# Patient Record
Sex: Male | Born: 1945 | Race: White | Hispanic: No | Smoking: Current every day smoker
Health system: Southern US, Community
[De-identification: ages and names within clinical notes are randomized; demographics above are authoritative.]

## PROBLEM LIST (undated history)

## (undated) DIAGNOSIS — I10 Essential (primary) hypertension: Secondary | ICD-10-CM

## (undated) DIAGNOSIS — E039 Hypothyroidism, unspecified: Secondary | ICD-10-CM

## (undated) DIAGNOSIS — R42 Dizziness and giddiness: Secondary | ICD-10-CM

## (undated) DIAGNOSIS — G8929 Other chronic pain: Secondary | ICD-10-CM

## (undated) DIAGNOSIS — R519 Headache, unspecified: Secondary | ICD-10-CM

## (undated) DIAGNOSIS — H9313 Tinnitus, bilateral: Secondary | ICD-10-CM

## (undated) HISTORY — DX: Headache, unspecified: R51.9

## (undated) HISTORY — DX: Tinnitus, bilateral: H93.13

## (undated) HISTORY — PX: MANDIBLE FRACTURE SURGERY: SHX706

## (undated) HISTORY — PX: KNEE SURGERY: SHX244

## (undated) HISTORY — DX: Essential (primary) hypertension: I10

## (undated) HISTORY — DX: Other chronic pain: G89.29

## (undated) HISTORY — DX: Hypothyroidism, unspecified: E03.9

## (undated) HISTORY — DX: Dizziness and giddiness: R42

---

## 2006-09-11 ENCOUNTER — Emergency Department (HOSPITAL_COMMUNITY): Admission: EM | Admit: 2006-09-11 | Discharge: 2006-09-11 | Payer: Self-pay | Admitting: Emergency Medicine

## 2006-11-18 ENCOUNTER — Emergency Department (HOSPITAL_COMMUNITY): Admission: EM | Admit: 2006-11-18 | Discharge: 2006-11-18 | Payer: Self-pay | Admitting: Emergency Medicine

## 2015-07-31 ENCOUNTER — Emergency Department (HOSPITAL_COMMUNITY): Payer: Medicare Other

## 2015-07-31 ENCOUNTER — Emergency Department (HOSPITAL_COMMUNITY)
Admission: EM | Admit: 2015-07-31 | Discharge: 2015-07-31 | Disposition: A | Discharge status: Home / Self Care | Payer: Medicare Other | Attending: Emergency Medicine | Admitting: Emergency Medicine

## 2015-07-31 ENCOUNTER — Encounter (HOSPITAL_COMMUNITY): Payer: Self-pay | Admitting: *Deleted

## 2015-07-31 DIAGNOSIS — W11XXXA Fall on and from ladder, initial encounter: Secondary | ICD-10-CM | POA: Insufficient documentation

## 2015-07-31 DIAGNOSIS — M25561 Pain in right knee: Secondary | ICD-10-CM | POA: Insufficient documentation

## 2015-07-31 DIAGNOSIS — Z720 Tobacco use: Secondary | ICD-10-CM | POA: Insufficient documentation

## 2015-07-31 DIAGNOSIS — R2241 Localized swelling, mass and lump, right lower limb: Secondary | ICD-10-CM | POA: Insufficient documentation

## 2015-07-31 DIAGNOSIS — Y929 Unspecified place or not applicable: Secondary | ICD-10-CM | POA: Diagnosis not present

## 2015-07-31 DIAGNOSIS — Y939 Activity, unspecified: Secondary | ICD-10-CM | POA: Insufficient documentation

## 2015-07-31 DIAGNOSIS — Y999 Unspecified external cause status: Secondary | ICD-10-CM | POA: Diagnosis not present

## 2015-07-31 MED ORDER — HYDROCODONE-ACETAMINOPHEN 5-325 MG PO TABS: 1.0000 | ORAL_TABLET | Freq: Four times a day (QID) | ORAL | Status: DC | PRN

## 2015-07-31 MED ORDER — NAPROXEN 500 MG PO TABS: 500.0000 mg | ORAL_TABLET | Freq: Two times a day (BID) | ORAL | Status: DC

## 2015-07-31 NOTE — Discharge Instructions (Signed)
X-ray of the right knee without any bony injuries. Use knee immobilizer for comfort. Can take it off to take shower. Make an appointment to follow-up with orthopedic surgery. Take the Naprosyn on a regular basis supplement with the hydrocodone as needed for additional pain relief.

## 2015-07-31 NOTE — ED Notes (Signed)
Dc instructions reviewed with pt. Voiced understanding. Pt report he has a knee immobilizer at home and does not need another one. 2 Rx given

## 2015-07-31 NOTE — ED Provider Notes (Signed)
CSN: [id number]     Arrival date & time 07/31/15  1027 History  By signing my name below, I, Emmanuella Mensah, attest that this documentation has been prepared under the direction and in the presence of Vanetta Mulders, MD. Electronically Signed: Angelene Giovanni, ED Scribe. 07/31/2015. 11:49 AM.    Chief Complaint  Patient presents with  . Knee Injury   The history is provided by the patient. No language interpreter was used.    HPI Comments: Joseph Glass is a 70 y.o. male who presents to the Emergency Department complaining of gradually worsening 10/10 right knee pain onset 1.5 weeks ago. He reports associated mild swelling in right knee. He explains his pain as a "needle pinch" that is worse in the morning when he wakes up. He states that he fell down a ladder in October 2016 and his right knee hurt for only 2 days. He explains that he had right knee surgery approx. 5-6 years ago in [place], [place]. No alleviating factors noted. Pt has not tried any medications PTA. He denies any fever, chills, rash, or any open wounds.     History reviewed. No pertinent past medical history. Past Surgical History  Procedure Laterality Date  . Knee surgery     No family history on file. Social History  Substance Use Topics  . Smoking status: Former Games developer  . Smokeless tobacco: Current User  . Alcohol Use: No    Review of Systems  Constitutional: Negative for fever and chills.  HENT: Positive for congestion. Negative for rhinorrhea and sore throat.   Eyes: Negative for visual disturbance.  Respiratory: Negative for cough and shortness of breath.   Cardiovascular: Negative for chest pain and leg swelling.  Gastrointestinal: Negative for nausea, vomiting, abdominal pain and diarrhea.  Genitourinary: Negative for dysuria and hematuria.  Musculoskeletal: Positive for back pain (chronic). Negative for joint swelling.  Skin: Negative for rash.  Neurological: Negative for headaches.  Hematological: Does  not bruise/bleed easily.      Allergies  Review of patient's allergies indicates no known allergies.  Home Medications   Prior to Admission medications   Medication Sig Start Date End Date Taking? Authorizing Provider  HYDROcodone-acetaminophen (NORCO/VICODIN) 5-325 MG tablet Take 1-2 tablets by mouth every 6 (six) hours as needed. 07/31/15   Vanetta Mulders, MD  naproxen (NAPROSYN) 500 MG tablet Take 1 tablet (500 mg total) by mouth 2 (two) times daily. 07/31/15   Vanetta Mulders, MD   BP 157/93 mmHg  Pulse 78  Temp(Src) 97.8 F (36.6 C) (Oral)  Resp 16  Ht  (1.651 m)  Wt 65.772 kg  BMI 24.13 kg/m2  SpO2 99% Physical Exam  Constitutional: He is oriented to person, place, and time. He appears well-developed and well-nourished. No distress.  HENT:  Head: Normocephalic and atraumatic.  Eyes: Conjunctivae and EOM are normal. Pupils are equal, round, and reactive to light.  Eyes tracking normal Sclera clear  Neck: Neck supple. No tracheal deviation present.  Cardiovascular: Normal rate and regular rhythm.   Pulmonary/Chest: Effort normal. No respiratory distress.  Lungs clear bilaterally  Abdominal: Bowel sounds are normal. There is no tenderness.  Musculoskeletal: Normal range of motion.  No ankle swelling DP pulses 2+ Right knee cap in place, mild swelling around the knee, no joint line tenderness, medial tenderness to the knee.   Neurological: He is alert and oriented to person, place, and time. No cranial nerve deficit. He exhibits normal muscle tone. Coordination normal.  Skin: Skin is warm  and dry.  Psychiatric: He has a normal mood and affect. His behavior is normal.  Nursing note and vitals reviewed.   ED Course  Procedures (including critical care time) DIAGNOSTIC STUDIES: Oxygen Saturation is 99% on RA, normal by my interpretation.    COORDINATION OF CARE: 11:37 AM- Pt advised of plan for treatment and pt agrees. Pt will receive knee x-ray for further  evaluation.   Imaging Review Dg Knee Complete 4 Views Right  07/31/2015  CLINICAL DATA:  Medial right knee pain for 1-2 weeks. Larey SeatFell off a ladder last November. EXAM: RIGHT KNEE - COMPLETE 4+ VIEW COMPARISON:  None. FINDINGS: Joint space narrowing in the medial compartment with mild spurring in the medial compartment. No fracture, subluxation or dislocation. No visible joint effusion although the lateral view is suboptimal and obliqued. IMPRESSION: Mild degenerative changes.  No acute bony abnormality. Electronically Signed   By: Charlett NoseKevin  Dover M.D.   On: 07/31/2015 11:25     Vanetta MuldersScott Jeanclaude Wentworth, MD has personally reviewed and evaluated these images as part of his medical decision-making.  MDM   Final diagnoses:  Knee pain, acute, right    Patient without acute injury to the right knee. Did have an injury back in October healed up. For the past week and a half and having a complaint of right knee pain and stiffness. X-rays negative for any bony injuries. Will refer to orthopedics for further evaluation. Will treat with knee immobilizer anti-inflammatories and pain medicine.   I personally performed the services described in this documentation, which was scribed in my presence. The recorded information has been reviewed and is accurate.     Vanetta MuldersScott Mikenzie Mccannon, MD 07/31/15 1213

## 2015-07-31 NOTE — ED Notes (Signed)
Pt states he has had chronic right knee pain with worsening since October. States he injured it then while trimming a tree. No other problem noted. Pt ambulatory.

## 2015-08-15 ENCOUNTER — Encounter: Payer: Self-pay | Admitting: Orthopaedic Surgery

## 2015-08-15 ENCOUNTER — Telehealth: Payer: Self-pay | Admitting: *Deleted

## 2015-08-15 ENCOUNTER — Ambulatory Visit (INDEPENDENT_AMBULATORY_CARE_PROVIDER_SITE_OTHER): Payer: Medicare Other | Admitting: Orthopaedic Surgery

## 2015-08-15 VITALS — Temp 97.2°F | Ht 65.0 in | Wt 144.0 lb

## 2015-08-15 DIAGNOSIS — M25561 Pain in right knee: Secondary | ICD-10-CM

## 2015-08-15 MED ORDER — HYDROCODONE-ACETAMINOPHEN 5-325 MG PO TABS: 1.0000 | ORAL_TABLET | ORAL | Status: DC | PRN

## 2015-08-15 MED ORDER — NAPROXEN 500 MG PO TABS: 500.0000 mg | ORAL_TABLET | Freq: Two times a day (BID) | ORAL | Status: DC

## 2015-08-15 NOTE — Progress Notes (Signed)
Subjective:    Patient ID: Joseph Glass, male    DOB: 11-19-1945, 70 y.o.   MRN: [id number]  Knee Pain  There was no injury mechanism. The pain is present in the right knee. The quality of the pain is described as burning and aching. The pain is at a severity of 5/10. The pain is moderate. The pain has been worsening since onset. Associated symptoms include a loss of motion. Pertinent negatives include no inability to bear weight, loss of sensation, muscle weakness, numbness or tingling. The symptoms are aggravated by weight bearing. He has tried heat, ice, NSAIDs and rest for the symptoms. The treatment provided moderate relief.   He has had increasing pain in the right knee over the last several weeks.  He has no trauma.  He has pain of the medial side of the right knee.  He has no giving way but feels it might.  He went to the ER on 07-31-15.  I have reviewed the notes and the x-rays and x-ray report.  He has DJD changes of the right knee.  He has had right knee surgery in the past in [place].  I will order a MRI of the knee as he is not getting better and has feelings of instability.   Review of Systems  HENT: Negative for congestion.   Respiratory: Negative for cough and shortness of breath.   Cardiovascular: Negative for leg swelling.  Endocrine: Negative for cold intolerance.  Musculoskeletal: Positive for joint swelling, arthralgias and gait problem.  Allergic/Immunologic: Negative for environmental allergies.  Neurological: Negative for tingling and numbness.   History reviewed. No pertinent past medical history.  Past Surgical History  Procedure Laterality Date  . Knee surgery      No current outpatient prescriptions on file prior to visit.   No current facility-administered medications on file prior to visit.    Social History   Social History  . Marital Status: Married    Spouse Name: N/A  . Number of Children: N/A  . Years of Education: N/A   Occupational  History  . Not on file.   Social History Main Topics  . Smoking status: Former Games developer  . Smokeless tobacco: Current User  . Alcohol Use: No  . Drug Use: No  . Sexual Activity: Not on file   Other Topics Concern  . Not on file   Social History Narrative    Temp(Src) 97.2 F (36.2 C)  Ht  (1.651 m)  Wt 144 lb (65.318 kg)  BMI 23.96 kg/m2     Objective:   Physical Exam  Constitutional: He is oriented to person, place, and time. He appears well-developed and well-nourished.  HENT:  Head: Normocephalic and atraumatic.  Eyes: Conjunctivae and EOM are normal. Pupils are equal, round, and reactive to light.  Neck: Normal range of motion. Neck supple.  Cardiovascular: Normal rate, regular rhythm and intact distal pulses.   Pulmonary/Chest: Effort normal.  Abdominal: Soft.  Musculoskeletal: He exhibits tenderness (pain right knee with ROM 0 to 100, crepitus, effusion, positive medial McMurray.  Limp to the right.  Left knee negative.).       Right knee: He exhibits decreased range of motion and effusion. Tenderness found. Medial joint line tenderness noted.       Legs: Neurological: He is alert and oriented to person, place, and time. He has normal reflexes. No cranial nerve deficit. He exhibits normal muscle tone. Coordination normal.  Skin: Skin is warm and dry.  Psychiatric:  He has a normal mood and affect. His behavior is normal. Judgment and thought content normal.       Assessment & Plan:   Encounter Diagnosis  Name Primary?  . Right knee pain Yes  Get MRI of the right knee.  Rx for hydrocodone and Naprosyn given.  Precautions discussed.  RTC after MRI of the right knee.  Call if any problem.

## 2015-08-15 NOTE — Telephone Encounter (Signed)
MRI scheduled for 08/18/15 at 8:00 am

## 2015-08-16 ENCOUNTER — Telehealth: Payer: Self-pay | Admitting: Orthopaedic Surgery

## 2015-08-16 NOTE — Telephone Encounter (Signed)
Patient called and wants to cancel MRI that is scheduled for 08/18/15 @[facility] . He states that he can't afford it.

## 2015-08-16 NOTE — Telephone Encounter (Signed)
Joseph Glass Karen with radiology and cancelled MRI appointment.

## 2015-08-16 NOTE — Telephone Encounter (Signed)
[facility]

## 2015-08-18 ENCOUNTER — Ambulatory Visit (HOSPITAL_COMMUNITY): Payer: Medicare Other

## 2015-08-23 ENCOUNTER — Telehealth: Payer: Self-pay | Admitting: Orthopaedic Surgery

## 2015-08-23 NOTE — Telephone Encounter (Signed)
Patient called and now wants to reschedule MRI.  Could you please set this up and then let the patient know appointment?  Thanks

## 2015-08-31 NOTE — Telephone Encounter (Signed)
Patient returned Tammy's call about re-scheduling MRI at [facility] which he cancelled in April. Please let patient know new MRI appointment.  (His insurance is Medicare only - no pre-authorization needed)

## 2015-09-06 NOTE — Telephone Encounter (Signed)
Rescheduled MRI and spoke with patient and gave him his new appointment times.

## 2015-09-11 ENCOUNTER — Ambulatory Visit (HOSPITAL_COMMUNITY)
Admission: RE | Admit: 2015-09-11 | Discharge: 2015-09-11 | Disposition: A | Discharge status: Home / Self Care | Payer: Medicare Other | Source: Ambulatory Visit | Attending: Orthopaedic Surgery | Admitting: Orthopaedic Surgery

## 2015-09-11 DIAGNOSIS — M2241 Chondromalacia patellae, right knee: Secondary | ICD-10-CM | POA: Insufficient documentation

## 2015-09-11 DIAGNOSIS — M25561 Pain in right knee: Secondary | ICD-10-CM | POA: Insufficient documentation

## 2015-09-11 DIAGNOSIS — X58XXXA Exposure to other specified factors, initial encounter: Secondary | ICD-10-CM | POA: Diagnosis not present

## 2015-09-11 DIAGNOSIS — S83206A Unspecified tear of unspecified meniscus, current injury, right knee, initial encounter: Secondary | ICD-10-CM | POA: Diagnosis not present

## 2015-09-13 ENCOUNTER — Ambulatory Visit (INDEPENDENT_AMBULATORY_CARE_PROVIDER_SITE_OTHER): Payer: Medicare Other | Admitting: Orthopaedic Surgery

## 2015-09-13 ENCOUNTER — Encounter: Payer: Self-pay | Admitting: Orthopaedic Surgery

## 2015-09-13 VITALS — BP 136/90 | HR 80 | Temp 97.7°F | Ht 65.0 in | Wt 142.0 lb

## 2015-09-13 DIAGNOSIS — M25561 Pain in right knee: Secondary | ICD-10-CM

## 2015-09-13 NOTE — Progress Notes (Signed)
Patient Joseph Glass, male DOB:20-May-1945, 70 y.o. VWU:[id number]  Chief Complaint  Patient presents with  . Results    MRI Right Knee    HPI  [name] is a 70 y.o. male who has had right knee pain and giving way and swelling.  It is still present.  He had a MRI of the right knee done on 09-12-15.  It shows: IMPRESSION: 1. Complex tear of the posterior horn- body of the medial meniscus. 2. Small radial tear of the free edge of the body of the lateral meniscus. 3. 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 10 mm osteochondral lesion involving the lateral aspect of the medial femoral condyle with fluid undercutting a portion of the fragment concerning for an unstable fragment. 4. Chondromalacia of the medial patellofemoral compartment.  I have recommended arthroscopy of the right knee.  I went over the procedure with him.  I showed him a model of the knee.  He asked appropriate questions.  I will have him seen by Dr. Romeo Apple in this office for arthroscopy.  The patient is agreeable.  HPI  Body mass index is 23.63 kg/(m^2).  ROS  Review of Systems  HENT: Negative for congestion.   Respiratory: Negative for cough and shortness of breath.   Cardiovascular: Negative for leg swelling.  Endocrine: Negative for cold intolerance.  Musculoskeletal: Positive for joint swelling, arthralgias and gait problem.  Allergic/Immunologic: Negative for environmental allergies.  Neurological: Negative for numbness.    History reviewed. No pertinent past medical history.  Past Surgical History  Procedure Laterality Date  . Knee surgery      History reviewed. No pertinent family history.  Social History Social History  Substance Use Topics  . Smoking status: Former Games developer  . Smokeless tobacco: Current User  . Alcohol Use: No    No Known Allergies  Current Outpatient  Prescriptions  Medication Sig Dispense Refill  . HYDROcodone-acetaminophen (NORCO/VICODIN) 5-325 MG tablet Take 1 tablet by mouth every 4 (four) hours as needed for moderate pain (Must last 30 days.  Do not take and drive a car or use machinery.). 120 tablet 0  . naproxen (NAPROSYN) 500 MG tablet Take 1 tablet (500 mg total) by mouth 2 (two) times daily with a meal. (Patient not taking: Reported on 09/13/2015) 60 tablet 5   No current facility-administered medications for this visit.     Physical Exam  Blood pressure 136/90, pulse 80, temperature 97.7 F (36.5 C), height  (1.651 m), weight 142 lb (64.411 kg).  Constitutional: overall normal hygiene, normal nutrition, well developed, normal grooming, normal body habitus. Assistive device:neoprene sleeve brace  Musculoskeletal: gait and station Limp right, muscle tone and strength are normal, no tremors or atrophy is present.  .  Neurological: coordination overall normal.  Deep tendon reflex/nerve stretch intact.  Sensation normal.  Cranial nerves II-XII intact.   Skin:   normal overall no scars, lesions, ulcers or rashes. No psoriasis.  Psychiatric: Alert and oriented x 3.  Recent memory intact, remote memory unclear.  Normal mood and affect. Well groomed.  Good eye contact.  Cardiovascular: overall no swelling, no varicosities, no edema bilaterally, normal temperatures of the legs and arms, no clubbing, cyanosis and good capillary refill.  Lymphatic: palpation is normal.  The right lower extremity is examined:  Inspection:  Thigh:  Non-tender and no defects  Knee has swelling 2+ effusion.  Joint tenderness is present                        Patient is tender over the medial joint line  Lower Leg:  Has normal appearance and no tenderness or defects  Ankle:  Non-tender and no defects  Foot:  Non-tender and no defects Range of Motion:  Knee:  Range of motion is: 0-100                        Crepitus is   present  Ankle:  Range of motion is normal. Strength and Tone:  The right lower extremity has normal strength and tone. Stability:  Knee:  The knee has positive medial McMurray.  Ankle:  The ankle is stable.   The patient has been educated about the nature of the problem(s) and counseled on treatment options.  The patient appeared to understand what I have discussed and is in agreement with it.  Encounter Diagnosis  Name Primary?  . Right knee pain Yes    PLAN Call if any problems.  Precautions discussed.  Continue current medications.   Return to clinic to see Dr. Romeo AppleHarrison.

## 2015-09-20 ENCOUNTER — Telehealth: Payer: Self-pay | Admitting: Orthopaedic Surgery

## 2015-09-20 MED ORDER — HYDROCODONE-ACETAMINOPHEN 5-325 MG PO TABS: 1.0000 | ORAL_TABLET | ORAL | Status: DC | PRN

## 2015-09-20 NOTE — Telephone Encounter (Signed)
Rx done. 

## 2015-09-20 NOTE — Telephone Encounter (Signed)
Patient requests refill on medication: HYDROcodone-acetaminophen (NORCO/VICODIN) 5-325 MG tablet [[id number]][[id number]] - quantity 120.  Please advise.

## 2015-09-24 ENCOUNTER — Emergency Department (HOSPITAL_COMMUNITY): Payer: Medicare Other

## 2015-09-24 ENCOUNTER — Encounter (HOSPITAL_COMMUNITY): Payer: Self-pay | Admitting: Emergency Medicine

## 2015-09-24 ENCOUNTER — Emergency Department (HOSPITAL_COMMUNITY)
Admission: EM | Admit: 2015-09-24 | Discharge: 2015-09-24 | Disposition: A | Discharge status: Home / Self Care | Payer: Medicare Other | Attending: Emergency Medicine | Admitting: Emergency Medicine

## 2015-09-24 DIAGNOSIS — W010XXD Fall on same level from slipping, tripping and stumbling without subsequent striking against object, subsequent encounter: Secondary | ICD-10-CM | POA: Insufficient documentation

## 2015-09-24 DIAGNOSIS — S42002D Fracture of unspecified part of left clavicle, subsequent encounter for fracture with routine healing: Secondary | ICD-10-CM

## 2015-09-24 DIAGNOSIS — S42022D Displaced fracture of shaft of left clavicle, subsequent encounter for fracture with routine healing: Secondary | ICD-10-CM | POA: Diagnosis not present

## 2015-09-24 DIAGNOSIS — Z79891 Long term (current) use of opiate analgesic: Secondary | ICD-10-CM | POA: Diagnosis not present

## 2015-09-24 DIAGNOSIS — Z87891 Personal history of nicotine dependence: Secondary | ICD-10-CM | POA: Diagnosis not present

## 2015-09-24 DIAGNOSIS — S4992XD Unspecified injury of left shoulder and upper arm, subsequent encounter: Secondary | ICD-10-CM | POA: Diagnosis present

## 2015-09-24 NOTE — ED Notes (Signed)
Pt states he was cleaning his pond out yesterday and slipped and fell.  C/o left shoulder pain/clavicle pain.  Deformity noted with significant swelling.

## 2015-09-24 NOTE — ED Provider Notes (Signed)
CSN: [id number]     Arrival date & time 09/24/15  1055 History  By signing my name below, I, Terrance Branch, attest that this documentation has been prepared under the direction and in the presence of Lavera Vandermeer, PA-C. Electronically Signed: Evon Slackerrance Branch, ED Scribe. 09/24/2015. 12:18 PM.     Chief Complaint  Patient presents with  . Shoulder Injury   Patient is a 70 y.o. male presenting with shoulder injury. The history is provided by the patient. No language interpreter was used.  Shoulder Injury Pertinent negatives include no chest pain, no abdominal pain, no headaches and no shortness of breath.   HPI Comments: Ursula BeathGeorge Gaza is a 70 y.o. male who presents to the Emergency Department complaining of left shoulder injury onset 1 day prior. Pt reports that he was cleaning out his pond yesterday, slipped and fell landing on his left shoulder. Pt presents with his left arm in a sling. Pt states that that the pain is worse with movement. Pt reports taking pain medication PTA. Pt reports hitting his head but denies LOC, headache, dizziness or visual changes. Pt denies numbness or tingling or swelling other than area of the collar bone.    History reviewed. No pertinent past medical history. Past Surgical History  Procedure Laterality Date  . Knee surgery     History reviewed. No pertinent family history. Social History  Substance Use Topics  . Smoking status: Former Games developermoker  . Smokeless tobacco: Current User  . Alcohol Use: No    Review of Systems  Constitutional: Negative for fever and chills.  Respiratory: Negative for shortness of breath.   Cardiovascular: Negative for chest pain.  Gastrointestinal: Negative for abdominal pain and abdominal distention.  Musculoskeletal: Positive for joint swelling and arthralgias (left shoulder pain). Negative for neck pain.  Skin: Negative for color change and wound.  Neurological: Negative for dizziness, syncope, numbness and headaches.   All other systems reviewed and are negative.    Allergies  Review of patient's allergies indicates no known allergies.  Home Medications   Prior to Admission medications   Medication Sig Start Date End Date Taking? Authorizing Provider  HYDROcodone-acetaminophen (NORCO/VICODIN) 5-325 MG tablet Take 1 tablet by mouth every 4 (four) hours as needed for moderate pain (Must last 30 days.  Do not take and drive a car or use machinery.). 09/20/15  Yes Darreld McleanWayne Keeling, MD  naproxen (NAPROSYN) 500 MG tablet Take 1 tablet (500 mg total) by mouth 2 (two) times daily with a meal. 08/15/15  Yes Darreld McleanWayne Keeling, MD   BP 134/87 mmHg  Pulse 74  Temp(Src) 98.1 F (36.7 C) (Tympanic)  Ht 5\' 5"  (1.651 m)  Wt 142 lb (64.411 kg)  BMI 23.63 kg/m2  SpO2 97%    Physical Exam  Constitutional: He is oriented to person, place, and time. He appears well-developed and well-nourished. No distress.  HENT:  Head: Normocephalic and atraumatic.  Eyes: Conjunctivae and EOM are normal.  Neck: Neck supple. No tracheal deviation present.  Cardiovascular: Normal rate.   Pulmonary/Chest: Effort normal. No respiratory distress.  Musculoskeletal: Normal range of motion.  Edema and bony deformity mid shaft left clavicle, pt currently wearing a sling. Radial pulse is brisk, distal sensation and grip strength intact. Compartments soft  Neurological: He is alert and oriented to person, place, and time.  Skin: Skin is warm and dry.  Psychiatric: He has a normal mood and affect. His behavior is normal.  Nursing note and vitals reviewed.   ED Course  Procedures (including critical care time) DIAGNOSTIC STUDIES: Oxygen Saturation is 97% on RA, normal by my interpretation.    COORDINATION OF CARE: 12:17 PM-Discussed treatment plan with pt at bedside and pt agreed to plan.     Labs Review Labs Reviewed - No data to display  Imaging Review Dg Shoulder Left  09/24/2015  CLINICAL DATA:  Fall yesterday.  Left shoulder  pain. EXAM: LEFT SHOULDER - 2+ VIEW COMPARISON:  Left shoulder imaging performed yesterday. Chest x-ray 08/20/2014. FINDINGS: There is a comminuted displaced mid left clavicle fracture. Distal fragment is displaced superiorly greater than 1 shaft with with overlapping fracture fragments. No change since yesterday's imaging at [facility]. Glenohumeral joint and AC joint appear intact. IMPRESSION: Comminuted, displaced and overlapping mid left clavicle fracture. Electronically Signed   By: Charlett Nose M.D.   On: 09/24/2015 12:01   I have personally reviewed and evaluated these images as part of my medical decision-making.   EKG Interpretation None      MDM   Final diagnoses:  Clavicle fracture, left, with routine healing, subsequent encounter    Pt reviewed on the [place] narcotic database, received #120 hydrocodone on 09/20/15.  fx of left clavicle and patient seen at another hospital for same and did not notify triage or myself initially until directly questioned.  Currently wearing a sling, NV intact.  No open wounds.  Sees orthopedics for knee pain and agrees to arrange f/u this week. He understands that further narcotics will not be given.  I personally performed the services described in this documentation, which was scribed in my presence. The recorded information has been reviewed and is accurate.      Pauline Aus, PA-C 09/25/15 2031  Eber Hong, MD 09/25/15 2148

## 2015-09-25 ENCOUNTER — Encounter: Payer: Self-pay | Admitting: Orthopedic Surgery

## 2015-09-25 ENCOUNTER — Ambulatory Visit (INDEPENDENT_AMBULATORY_CARE_PROVIDER_SITE_OTHER): Payer: Medicare Other | Admitting: Orthopedic Surgery

## 2015-09-25 VITALS — BP 142/88 | Ht 65.0 in | Wt 142.0 lb

## 2015-09-25 DIAGNOSIS — S42002A Fracture of unspecified part of left clavicle, initial encounter for closed fracture: Secondary | ICD-10-CM

## 2015-09-25 NOTE — Patient Instructions (Signed)
Clavicle Fracture  The clavicle, also called the collarbone, is the long bone that connects your shoulder to your rib cage. You can feel your collarbone at the top of your shoulders and rib cage. A clavicle fracture is a broken clavicle. It is a common injury that can happen at any age.   CAUSES  Common causes of a clavicle fracture include:  · A direct blow to your shoulder.  · A car accident.  · A fall, especially if you try to break your fall with an outstretched arm.  RISK FACTORS  You may be at increased risk if:  · You are younger than 25 years or older than 75 years. Most clavicle fractures happen to people who are younger than 25 years.  · You are a male.  · You play contact sports.  SIGNS AND SYMPTOMS  A fractured clavicle is painful. It also makes it hard to move your arm. Other signs and symptoms may include:  · A shoulder that drops downward and forward.  · Pain when trying to lift your shoulder.  · Bruising, swelling, and tenderness over your clavicle.  · A grinding noise when you try to move your shoulder.  · A bump over your clavicle.  DIAGNOSIS  Your health care provider can usually diagnose a clavicle fracture by asking about your injury and examining your shoulder and clavicle. He or she may take an X-ray to determine the position of your clavicle.  TREATMENT  Treatment depends on the position of your clavicle after the fracture:  · If the broken ends of the bone are not out of place, your health care provider may put your arm in a sling or wrap a support bandage around your chest (figure-of-eight wrap).  · If the broken ends of the bone are out of place, you may need surgery. Surgery may involve placing screws, pins, or plates to keep your clavicle stable while it heals. Healing may take about 3 months.  When your health care provider thinks your fracture has healed enough, you may have to do physical therapy to regain normal movement and build up your arm strength.  HOME CARE INSTRUCTIONS    · Apply ice to the injured area:    Put ice in a plastic bag.    Place a towel between your skin and the bag.    Leave the ice on for 20 minutes, 2-3 times a day.  · If you have a wrap or splint:    Wear it all the time, and remove it only to take a bath or shower.    When you bathe or shower, keep your shoulder in the same position as when the sling or wrap is on.    Do not lift your arm.  · If you have a figure-of-eight wrap:    Another person must tighten it every day.    It should be tight enough to hold your shoulders back.    Allow enough room to place your index finger between your body and the strap.    Loosen the wrap immediately if you feel numbness or tingling in your hands.  · Only take medicines as directed by your health care provider.  · Avoid activities that make the injury or pain worse for 4-6 weeks after surgery.  · Keep all follow-up appointments.  SEEK MEDICAL CARE IF:   Your medicine is not helping to relieve pain and swelling.  SEEK IMMEDIATE MEDICAL CARE IF:   Your arm is   numb, cold, or pale, even when the splint is loose.  MAKE SURE YOU:   · Understand these instructions.  · Will watch your condition.  · Will get help right away if you are not doing well or get worse.     This information is not intended to replace advice given to you by your health care provider. Make sure you discuss any questions you have with your health care provider.     Document Released: 01/16/2005 Document Revised: 04/13/2013 Document Reviewed: 03/01/2013  Elsevier Interactive Patient Education ©2016 [employer]

## 2015-09-25 NOTE — Progress Notes (Signed)
Chief Complaint  Patient presents with  . Clavicle Injury    ER FOLLOW UP LEFT CLAVICLE FX, DOI 09/23/15   HPI 70 year old male fell 2 days ago injured his left clavicle. He complains of left shoulder pain dull ache moderately severe 2 days duration constant worse with motion  Review of Systems  Constitutional: Negative for fever and chills.  Neurological: Negative for tingling.    No past medical history on file.  Past Surgical History  Procedure Laterality Date  . Knee surgery     No family history on file. Social History  Substance Use Topics  . Smoking status: Former Games developermoker  . Smokeless tobacco: Current User  . Alcohol Use: No    Current outpatient prescriptions:  .  HYDROcodone-acetaminophen (NORCO/VICODIN) 5-325 MG tablet, Take 1 tablet by mouth every 4 (four) hours as needed for moderate pain (Must last 30 days.  Do not take and drive a car or use machinery.)., Disp: 120 tablet, Rfl: 0 .  naproxen (NAPROSYN) 500 MG tablet, Take 1 tablet (500 mg total) by mouth 2 (two) times daily with a meal., Disp: 60 tablet, Rfl: 5  BP 142/88 mmHg  Ht 5\' 5"  (1.651 m)  Wt 142 lb (64.411 kg)  BMI 23.63 kg/m2  Physical Exam  Constitutional: He is oriented to person, place, and time. He appears well-developed and well-nourished. No distress.  Cardiovascular: Normal rate and intact distal pulses.   Neurological: He is alert and oriented to person, place, and time.  Skin: Skin is warm and dry. No rash noted. He is not diaphoretic. No erythema. No pallor.  Psychiatric: He has a normal mood and affect. His behavior is normal. Judgment and thought content normal.    Ortho Exam He is walking normally there are no other injuries. His right shoulder has no swelling or tenderness and full range of motion neurovascular exam intact skin normal  The left shoulder is tender over the clavicle with palpable crepitance he basically has the shoulder pain to his body because of pain we couldn't test  stability for pain muscle tone was normal skin shows ecchymosis distal pulses were intact lymph nodes in the axilla were normal and had normal sensation in the hand  ASSESSMENT: My personal interpretation of the images:  3 x-rays left shoulder comminuted fracture left clavicle midshaft minimally displaced  PLAN Sling x-ray in 4 weeks  11:08 AM Fuller CanadaStanley Harrison, MD 09/25/2015

## 2015-09-27 ENCOUNTER — Telehealth: Payer: Self-pay | Admitting: Orthopedic Surgery

## 2015-09-27 NOTE — Telephone Encounter (Signed)
BACK BRACE ?????

## 2015-09-27 NOTE — Telephone Encounter (Signed)
Spoke with patient, explained to him again the only two options for his injury are the sling OR figure 8 harness, explained the differences and patient opted to stay with sling

## 2015-09-27 NOTE — Telephone Encounter (Signed)
Patient called asking if he needs to use a back brace since he is hurting so bad. He says he has a sling given to him by the hospital and he has been using it, but was wondering about the backbrace.  Please call and advise

## 2015-09-27 NOTE — Telephone Encounter (Signed)
Routing to Dr Harrison for review 

## 2015-10-02 ENCOUNTER — Ambulatory Visit: Payer: Medicare Other | Admitting: Orthopedic Surgery

## 2015-10-26 ENCOUNTER — Ambulatory Visit (INDEPENDENT_AMBULATORY_CARE_PROVIDER_SITE_OTHER): Payer: Medicare Other

## 2015-10-26 ENCOUNTER — Encounter: Payer: Self-pay | Admitting: Orthopedic Surgery

## 2015-10-26 ENCOUNTER — Ambulatory Visit: Payer: Medicare Other | Admitting: Orthopedic Surgery

## 2015-10-26 VITALS — BP 128/79 | HR 67 | Temp 97.7°F | Ht 65.0 in | Wt 138.0 lb

## 2015-10-26 DIAGNOSIS — S42002D Fracture of unspecified part of left clavicle, subsequent encounter for fracture with routine healing: Secondary | ICD-10-CM | POA: Diagnosis not present

## 2015-10-26 NOTE — Progress Notes (Signed)
Chief complaint fracture left clavicle date of injury 09/23/2015 Office visit 09/25/2015  Patient has no neurologic symptoms specifically ulnar nerve brachial plexus  Heand normal. Ecchymosis is improving.  X-ray shows fracture line with minimal shortening minimal displacement  Recommend x-ray again 4-5 weeks from now

## 2015-11-08 ENCOUNTER — Telehealth: Payer: Self-pay | Admitting: Orthopedic Surgery

## 2015-11-08 NOTE — Telephone Encounter (Signed)
Patient called saying that Dr. Romeo AppleHarrison had told him to call and request his medication. He said it was Hydrocodone-Acetaminophen 5/325mg   Qty 84 Tablets

## 2015-11-13 ENCOUNTER — Other Ambulatory Visit: Payer: Self-pay | Admitting: *Deleted

## 2015-11-13 MED ORDER — HYDROCODONE-ACETAMINOPHEN 5-325 MG PO TABS: 1.0000 | ORAL_TABLET | ORAL | 0 refills | Status: DC | PRN

## 2015-11-13 NOTE — Telephone Encounter (Signed)
Routing to Dr Harrison for approval 

## 2015-11-13 NOTE — Telephone Encounter (Signed)
42

## 2015-11-23 ENCOUNTER — Ambulatory Visit: Payer: Medicare Other | Admitting: Orthopedic Surgery

## 2015-12-04 ENCOUNTER — Ambulatory Visit (INDEPENDENT_AMBULATORY_CARE_PROVIDER_SITE_OTHER): Payer: Medicare Other

## 2015-12-04 ENCOUNTER — Ambulatory Visit (INDEPENDENT_AMBULATORY_CARE_PROVIDER_SITE_OTHER): Payer: Medicare Other | Admitting: Orthopedic Surgery

## 2015-12-04 ENCOUNTER — Encounter: Payer: Self-pay | Admitting: Orthopedic Surgery

## 2015-12-04 VITALS — BP 133/85 | Ht 65.0 in | Wt 141.0 lb

## 2015-12-04 DIAGNOSIS — S42002D Fracture of unspecified part of left clavicle, subsequent encounter for fracture with routine healing: Secondary | ICD-10-CM

## 2015-12-04 MED ORDER — HYDROCODONE-ACETAMINOPHEN 5-325 MG PO TABS: 1.0000 | ORAL_TABLET | Freq: Three times a day (TID) | ORAL | 0 refills | Status: DC | PRN

## 2015-12-04 NOTE — Patient Instructions (Signed)
START KNEE EXERCISES

## 2015-12-04 NOTE — Progress Notes (Signed)
Follow-up fracture care  Chief Complaint  Patient presents with  . Follow-up    left clavicle fracture   Date of injury June 3  Week #10  X-ray shows overriding fracture fragments at the fracture site  He still has some tenderness there.  We refilled his hydrocodone with a weaning process down to 30 tablets one every 8  Follow-up 6 weeks  Start Codman exercises at home out of the sling  Wear sling for comfort.  BP 133/85   Ht 5\' 5"  (1.651 m)   Wt 141 lb (64 kg)   BMI 23.46 kg/m

## 2016-01-12 ENCOUNTER — Ambulatory Visit (INDEPENDENT_AMBULATORY_CARE_PROVIDER_SITE_OTHER): Payer: Medicare Other | Admitting: Orthopedic Surgery

## 2016-01-12 ENCOUNTER — Encounter: Payer: Self-pay | Admitting: Orthopedic Surgery

## 2016-01-12 VITALS — BP 162/97 | HR 64 | Wt 144.0 lb

## 2016-01-12 DIAGNOSIS — S42002D Fracture of unspecified part of left clavicle, subsequent encounter for fracture with routine healing: Secondary | ICD-10-CM

## 2016-01-12 MED ORDER — IBUPROFEN 800 MG PO TABS: 800.0000 mg | ORAL_TABLET | Freq: Three times a day (TID) | ORAL | 5 refills | Status: DC | PRN

## 2016-01-12 MED ORDER — HYDROCODONE-ACETAMINOPHEN 5-325 MG PO TABS: 1.0000 | ORAL_TABLET | Freq: Three times a day (TID) | ORAL | 0 refills | Status: DC | PRN

## 2016-01-12 NOTE — Patient Instructions (Signed)
Use sling as little as possible  Continue shoulder exercises  Take ibuprofen 800 mg 3 x a day and norco 5 mg every 8 hours as needed

## 2016-01-12 NOTE — Progress Notes (Signed)
Patient ID: Joseph Glass, male   DOB: 1946/03/05, 70 y.o.   MRN: [id number]  Chief Complaint  Patient presents with  . Follow-up    left clavicle fracture, DOI 09/23/15    HPI Joseph BeathGeorge Glass is a 70 y.o. male.   HPI 14 weeks status post left collarbone fracture. Treated with closed means with swelling. Presents for reevaluation.  Last x-ray shows fracture overriding. Patient complains of aching pain left shoulder  Patient started physical therapy at home  Review of Systems Review of Systems Normal neuro  Denies fever  Examination BP (!) 162/97   Pulse 64   Wt 144 lb (65.3 kg)   BMI 23.96 kg/m   Gen. appearance the patient's appearance is normal with normal grooming and  hygiene The patient is oriented to person place and time Mood and affect are normal   Ortho Exam Gait is remarkable for normal heel-to-toe Inspection reveals overriding of fracture fragments little tenderness to palpation of fracture site ROM is remarkable for flexion 90 Stability tests are normal  Motor exam 5/5 manual muscle testing , no atrophy  Skin is normal (no rash or erythema)    Medical decision-making Diagnosis, Data, Plan (risk)  Continue exercises at home, useful in as little as possible, start ibuprofen 800 mg 3 times a day return in 2 months repeat x-ray   Fuller CanadaStanley Avaiah Stempel, MD 01/12/2016 8:57 AM

## 2016-03-13 ENCOUNTER — Ambulatory Visit: Payer: Medicare Other | Admitting: Orthopedic Surgery

## 2016-12-12 ENCOUNTER — Other Ambulatory Visit: Payer: Self-pay | Admitting: Orthopedic Surgery

## 2017-08-13 IMAGING — MR MR KNEE*R* W/O CM
4 of 6 series · 13 of 40 positions shown · non-contrast
Comparison: None.

CLINICAL DATA: Right knee pain x 2 months, twisting injury while
trimming tree in pt's yard.

EXAM:
MRI OF THE RIGHT KNEE WITHOUT CONTRAST
TECHNIQUE: Multiplanar, multisequence MR imaging of the knee was performed. No
intravenous contrast was administered.

[Series 3: pdfs axial · axial · 3.0mm · 0.22mm/px · z∈[-67,+4]mm · 3 of 30 slices shown]
[im 5/30]
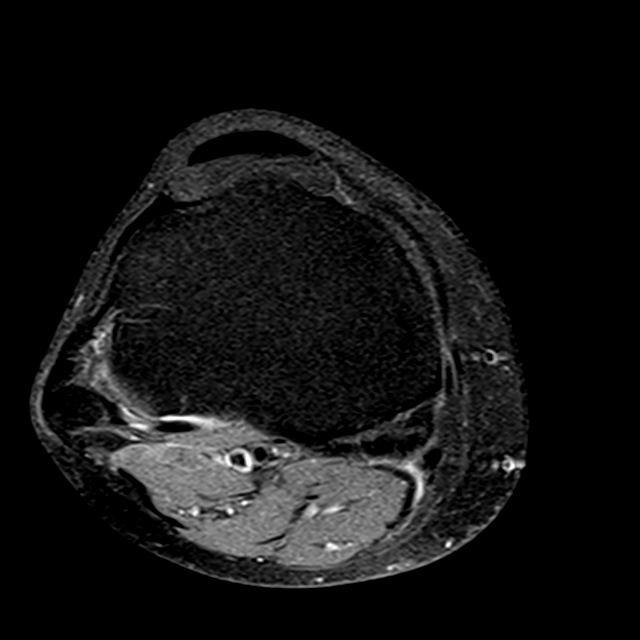
[im 17/30]
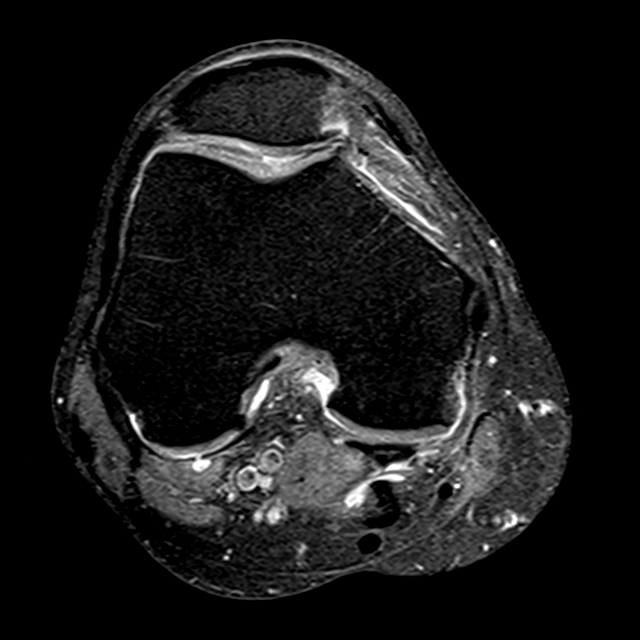
[im 25/30]
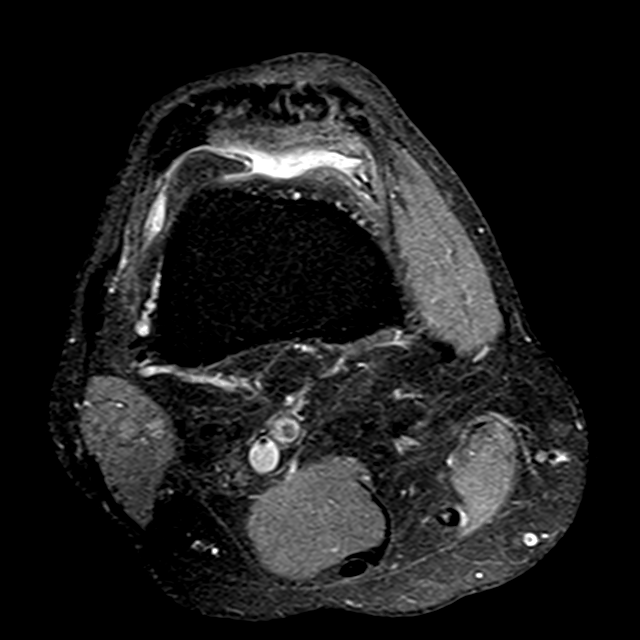

[Series 4: T1 · coronal · 3.0mm · 0.23mm/px · 4 of 26 slices shown]
[im 1/26]
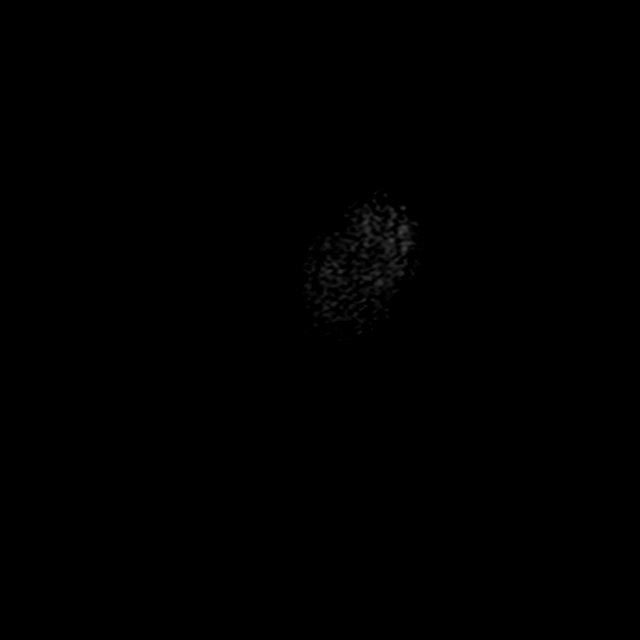
[im 5/26]
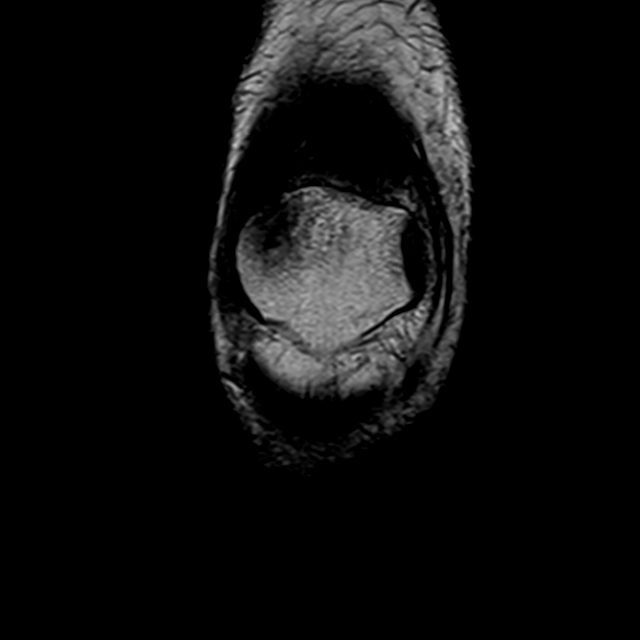
[im 13/26]
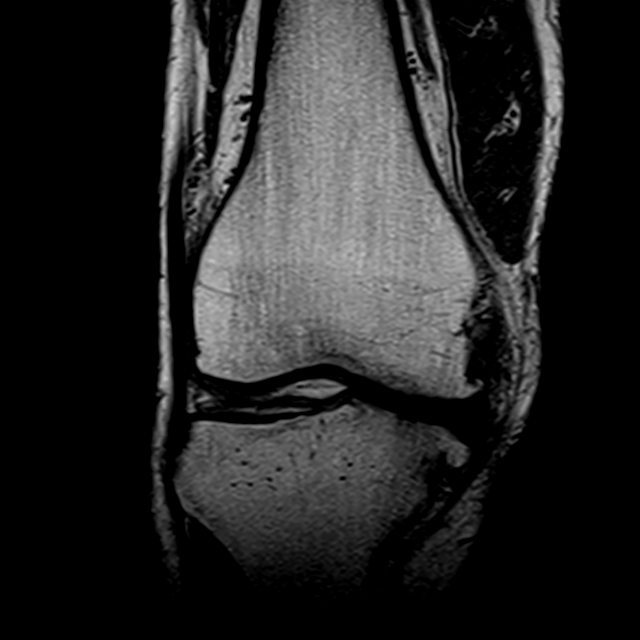
[im 21/26]
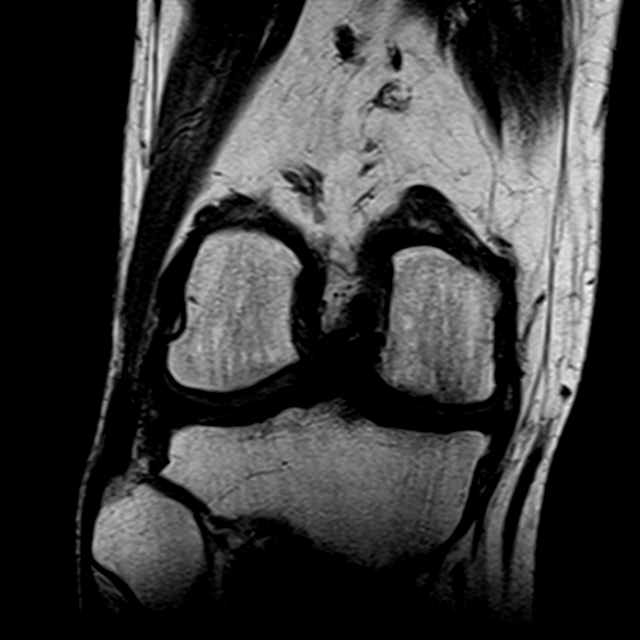

[Series 5: pdfs sag · sagittal · 3.0mm · 0.23mm/px · 3 of 27 slices shown]
[im 5/27]
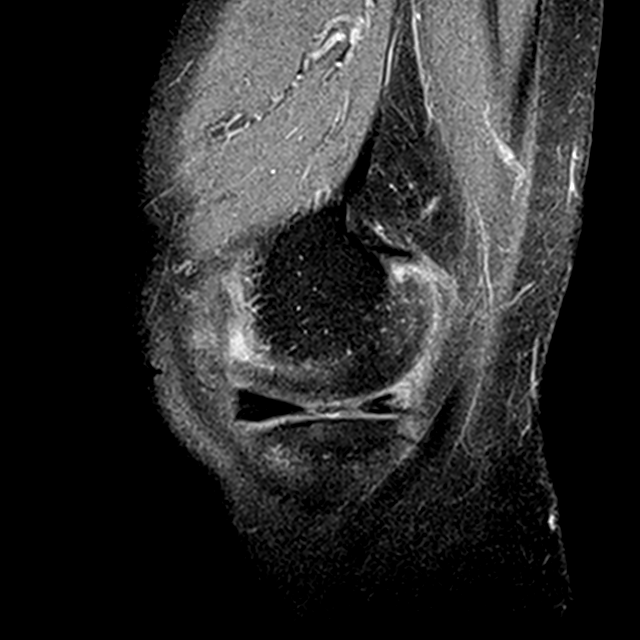
[im 14/27]
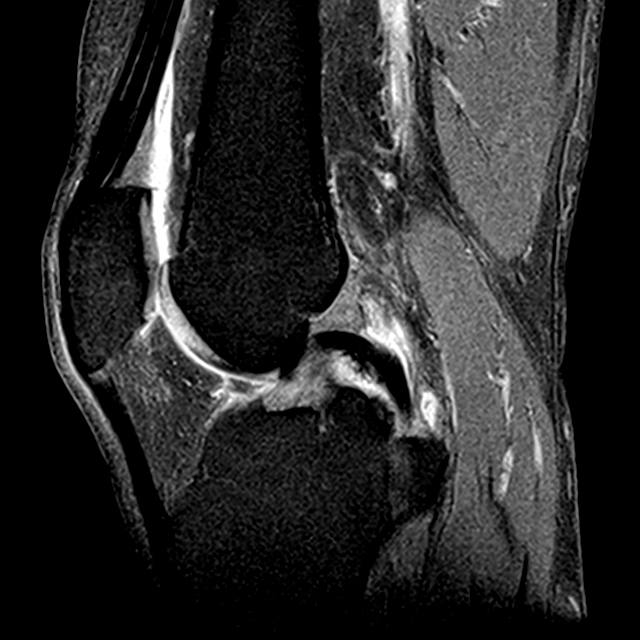
[im 22/27]
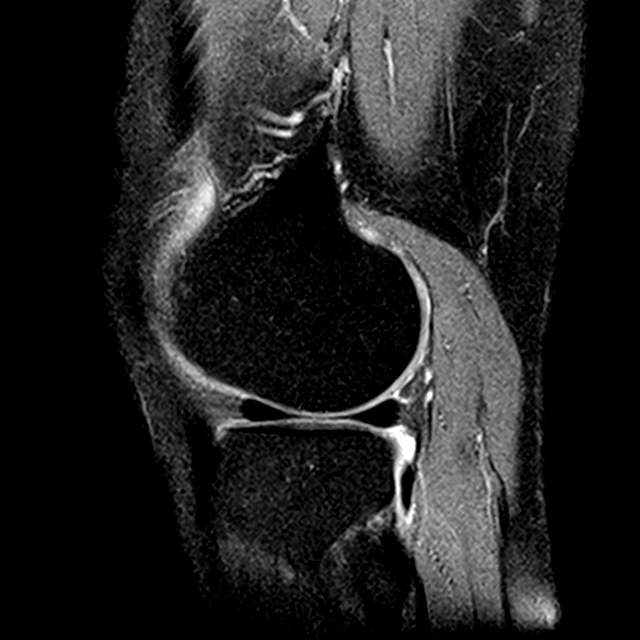

[Series 6: t2fs cor · coronal · 3.0mm · 0.23mm/px · 3 of 26 slices shown]
[im 5/26]
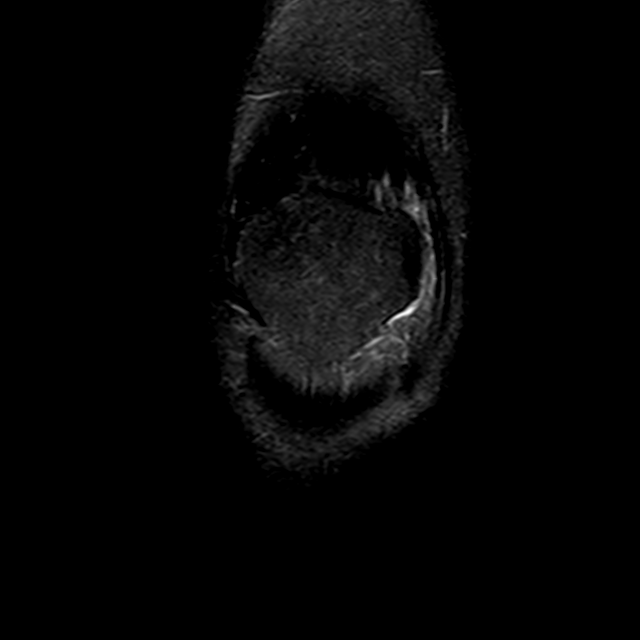
[im 13/26]
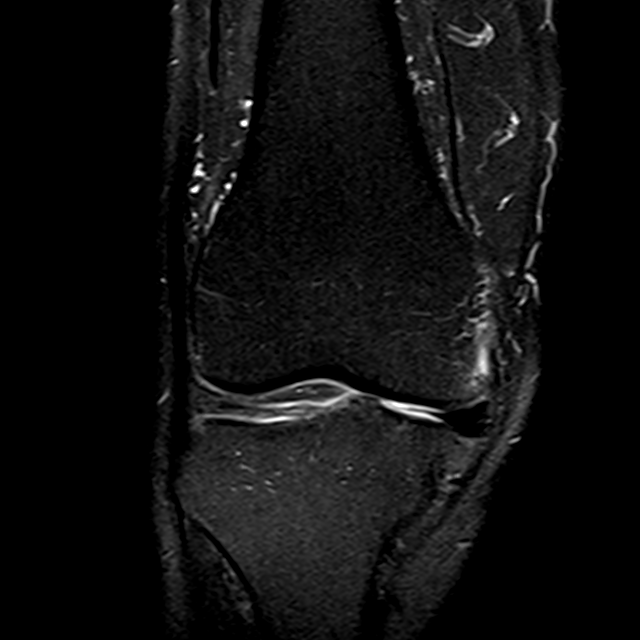
[im 21/26]
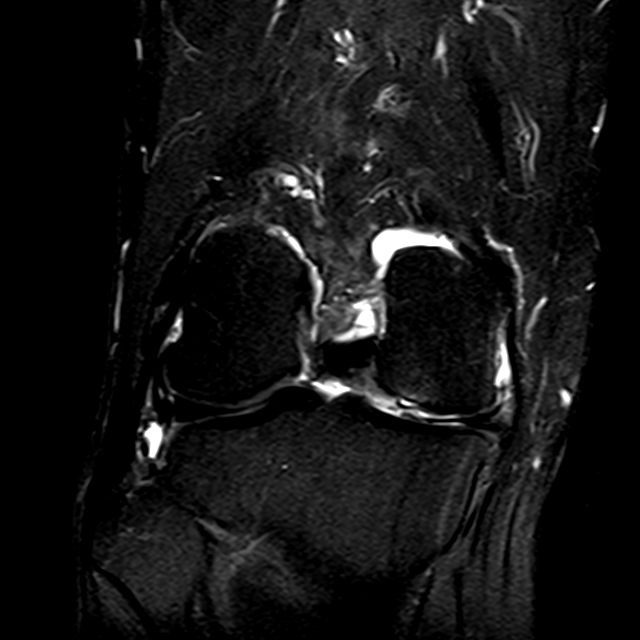

[13 of 40 positions shown; findings below may reference images not displayed]

FINDINGS: MENISCI

Medial meniscus: Complex tear of the posterior horn- body of the
medial meniscus.

Lateral meniscus: Small radial tear of the free edge of the body of
the lateral meniscus.

LIGAMENTS

Cruciates:  Intact ACL and PCL.

Collaterals: Medial collateral ligament is intact. Lateral
collateral ligament complex is intact.

CARTILAGE

Patellofemoral: Chondromalacia of the medial patellofemoral
compartment.

Medial: Extensive full-thickness cartilage loss of the medial
femoral condyle and medial tibial plateau with subchondral reactive
marrow changes. 10 mm osteochondral lesion involving the lateral
aspect of the medial femoral condyle with fluid undercutting a
portion of the fragment concerning for an unstable fragment.

Lateral:  No chondral defect.

Joint: Small joint effusion. Normal Hoffa's fat. No plical
thickening.

Popliteal Fossa:  Tiny Baker cyst.  Intact popliteus tendon.

Extensor Mechanism:  Intact.

Bones: No other marrow signal abnormality. No fracture or
dislocation.
IMPRESSION: 1. Complex tear of the posterior horn- body of the medial meniscus.
2. Small radial tear of the free edge of the body of the lateral
meniscus.
3. Extensive full-thickness cartilage loss of the medial femoral
condyle and medial tibial plateau with subchondral reactive marrow
changes. 10 mm osteochondral lesion involving the lateral aspect of
the medial femoral condyle with fluid undercutting a portion of the
fragment concerning for an unstable fragment.
4. Chondromalacia of the medial patellofemoral compartment.

## 2017-08-22 ENCOUNTER — Ambulatory Visit (INDEPENDENT_AMBULATORY_CARE_PROVIDER_SITE_OTHER): Payer: Medicare Other | Admitting: Orthopedic Surgery

## 2017-08-22 ENCOUNTER — Ambulatory Visit (INDEPENDENT_AMBULATORY_CARE_PROVIDER_SITE_OTHER): Payer: Medicare Other

## 2017-08-22 ENCOUNTER — Encounter: Payer: Self-pay | Admitting: Orthopedic Surgery

## 2017-08-22 VITALS — BP 144/94 | HR 90 | Ht 65.0 in | Wt 139.0 lb

## 2017-08-22 DIAGNOSIS — G8929 Other chronic pain: Secondary | ICD-10-CM

## 2017-08-22 DIAGNOSIS — M1711 Unilateral primary osteoarthritis, right knee: Secondary | ICD-10-CM | POA: Diagnosis not present

## 2017-08-22 DIAGNOSIS — M25561 Pain in right knee: Secondary | ICD-10-CM

## 2017-08-22 MED ORDER — DICLOFENAC SODIUM 75 MG PO TBEC: 75.0000 mg | DELAYED_RELEASE_TABLET | Freq: Two times a day (BID) | ORAL | 2 refills | Status: DC

## 2017-08-22 NOTE — Progress Notes (Signed)
Progress Note   Patient ID: Joseph Glass, male   DOB: [birth year], 72 y.o.   MRN: [id number] Chief Complaint  Patient presents with  . Knee Pain    right knee has been painful for about a year      Medical decision-making  Imaging:  Xrays ordered: y/n ? yes  Encounter Diagnoses  Name Primary?  . Chronic pain of right knee Yes  . Primary osteoarthritis of right knee       Meds ordered this encounter  Medications  . diclofenac (VOLTAREN) 75 MG EC tablet    Sig: Take 1 tablet (75 mg total) by mouth 2 (two) times daily with a meal.    Dispense:  60 tablet    Refill:  2    PLAN: The patient did not want an injection  He will be started on diclofenac 75 mg twice a day we will see him in 6 months  At some point he will need to have a total knee replacement but his pain and functional level at this time does not warrant despite having severe x-ray changes  X-ray in 6 months  Joseph Canada, MD 08/22/2017 10:24 AM     Chief Complaint  Patient presents with  . Knee Pain    right knee has been painful for about a year     72 year old male presents for evaluation of right knee pain x1 year  He had surgery on his right knee several years ago he said they took out a piece of bone it sounds like he had an arthroscopic meniscectomy he presents with a 1 year history of progressively increasing dull aching knee pain over the medial side of his right knee associated with some stiffness.  Pain is mild to moderate waxing and waning depending on activity    Review of Systems  All other systems reviewed and are negative.  No outpatient medications have been marked as taking for the 08/22/17 encounter (Office Visit) with Vickki Hearing, MD.    History reviewed. No pertinent past medical history.  The patient denies having hypertension diabetes or heart disease   No Known Allergies  BP (!) 144/94   Pulse 90   Ht  (1.651 m)   Wt 139 lb (63 kg)   BMI 23.13 kg/m     Physical Exam  Constitutional: He is oriented to person, place, and time. He appears well-developed and well-nourished.  Vital signs have been reviewed and are stable. Gen. appearance the patient is well-developed and well-nourished with normal grooming and hygiene.  Body habitus small ectomorphic  Musculoskeletal:       Right knee: He exhibits no effusion.       Left knee: He exhibits no effusion.  Neurological: He is alert and oriented to person, place, and time.  Skin: Skin is warm and dry. No erythema.  Psychiatric: He has a normal mood and affect.  Vitals reviewed.   Right Knee Exam   Muscle Strength  The patient has normal right knee strength.  Tenderness  The patient is experiencing tenderness in the medial joint line.  Range of Motion  Extension: normal Right knee extension: 3.  Flexion: normal Right knee flexion: 125.   Tests  McMurray:  Medial - negative Lateral - negative Varus: negative Valgus: negative Drawer:  Anterior - negative    Posterior - negative  Other  Erythema: absent Scars: absent Sensation: normal Pulse: present Swelling: none Effusion: no effusion present   Left Knee Exam  Muscle Strength  The patient has normal left knee strength.  Tenderness  The patient is experiencing no tenderness.   Range of Motion  Extension: normal  Flexion: normal   Tests  McMurray:  Medial - negative Lateral - negative Varus: negative Valgus: negative Drawer:  Anterior - negative     Posterior - negative  Other  Erythema: absent Scars: absent Sensation: normal Pulse: present Swelling: none Effusion: no effusion present

## 2018-02-23 ENCOUNTER — Encounter: Payer: Self-pay | Admitting: Orthopedic Surgery

## 2018-02-23 ENCOUNTER — Ambulatory Visit: Payer: Medicare Other | Admitting: Orthopedic Surgery

## 2020-06-07 DIAGNOSIS — F32A Depression, unspecified: Secondary | ICD-10-CM | POA: Diagnosis not present

## 2020-06-07 DIAGNOSIS — Z1159 Encounter for screening for other viral diseases: Secondary | ICD-10-CM | POA: Diagnosis not present

## 2020-06-07 DIAGNOSIS — R7309 Other abnormal glucose: Secondary | ICD-10-CM | POA: Diagnosis not present

## 2020-06-07 DIAGNOSIS — R030 Elevated blood-pressure reading, without diagnosis of hypertension: Secondary | ICD-10-CM | POA: Diagnosis not present

## 2020-06-07 DIAGNOSIS — R519 Headache, unspecified: Secondary | ICD-10-CM | POA: Diagnosis not present

## 2020-06-07 DIAGNOSIS — Z13220 Encounter for screening for lipoid disorders: Secondary | ICD-10-CM | POA: Diagnosis not present

## 2020-06-07 DIAGNOSIS — Z136 Encounter for screening for cardiovascular disorders: Secondary | ICD-10-CM | POA: Diagnosis not present

## 2020-06-08 DIAGNOSIS — R9431 Abnormal electrocardiogram [ECG] [EKG]: Secondary | ICD-10-CM | POA: Diagnosis not present

## 2020-06-08 DIAGNOSIS — R42 Dizziness and giddiness: Secondary | ICD-10-CM | POA: Diagnosis not present

## 2020-06-08 DIAGNOSIS — Z79899 Other long term (current) drug therapy: Secondary | ICD-10-CM | POA: Diagnosis not present

## 2020-06-08 DIAGNOSIS — Z87891 Personal history of nicotine dependence: Secondary | ICD-10-CM | POA: Diagnosis not present

## 2020-06-08 DIAGNOSIS — I10 Essential (primary) hypertension: Secondary | ICD-10-CM | POA: Diagnosis not present

## 2020-06-08 DIAGNOSIS — R079 Chest pain, unspecified: Secondary | ICD-10-CM | POA: Diagnosis not present

## 2020-06-08 DIAGNOSIS — J189 Pneumonia, unspecified organism: Secondary | ICD-10-CM | POA: Diagnosis not present

## 2020-06-14 DIAGNOSIS — R768 Other specified abnormal immunological findings in serum: Secondary | ICD-10-CM | POA: Diagnosis not present

## 2020-06-14 DIAGNOSIS — I10 Essential (primary) hypertension: Secondary | ICD-10-CM | POA: Diagnosis not present

## 2020-06-14 DIAGNOSIS — Z9114 Patient's other noncompliance with medication regimen: Secondary | ICD-10-CM | POA: Diagnosis not present

## 2020-06-14 DIAGNOSIS — F32A Depression, unspecified: Secondary | ICD-10-CM | POA: Diagnosis not present

## 2020-06-19 ENCOUNTER — Encounter (INDEPENDENT_AMBULATORY_CARE_PROVIDER_SITE_OTHER): Payer: Self-pay | Admitting: *Deleted

## 2020-09-21 ENCOUNTER — Encounter (INDEPENDENT_AMBULATORY_CARE_PROVIDER_SITE_OTHER): Payer: Self-pay | Admitting: *Deleted

## 2020-09-21 ENCOUNTER — Ambulatory Visit (INDEPENDENT_AMBULATORY_CARE_PROVIDER_SITE_OTHER): Payer: Medicare Other | Admitting: Gastroenterology

## 2020-09-21 ENCOUNTER — Telehealth (INDEPENDENT_AMBULATORY_CARE_PROVIDER_SITE_OTHER): Payer: Self-pay

## 2020-09-21 ENCOUNTER — Encounter (INDEPENDENT_AMBULATORY_CARE_PROVIDER_SITE_OTHER): Payer: Self-pay | Admitting: Gastroenterology

## 2020-09-21 NOTE — Telephone Encounter (Signed)
Patient no showed for appointment on 09/21/2020 with Dr. Katrinka Blazing. Was referred by Dr. Shellia Carwin

## 2021-07-13 DIAGNOSIS — I10 Essential (primary) hypertension: Secondary | ICD-10-CM | POA: Diagnosis not present

## 2021-07-13 DIAGNOSIS — H9319 Tinnitus, unspecified ear: Secondary | ICD-10-CM | POA: Diagnosis not present

## 2021-07-13 DIAGNOSIS — R519 Headache, unspecified: Secondary | ICD-10-CM | POA: Diagnosis not present

## 2021-07-13 DIAGNOSIS — F32A Depression, unspecified: Secondary | ICD-10-CM | POA: Diagnosis not present

## 2021-07-13 DIAGNOSIS — E559 Vitamin D deficiency, unspecified: Secondary | ICD-10-CM | POA: Diagnosis not present

## 2021-07-13 DIAGNOSIS — R42 Dizziness and giddiness: Secondary | ICD-10-CM | POA: Diagnosis not present

## 2021-07-13 DIAGNOSIS — D582 Other hemoglobinopathies: Secondary | ICD-10-CM | POA: Diagnosis not present

## 2021-07-16 ENCOUNTER — Other Ambulatory Visit (HOSPITAL_COMMUNITY): Payer: Self-pay | Admitting: Internal Medicine

## 2021-07-16 DIAGNOSIS — R42 Dizziness and giddiness: Secondary | ICD-10-CM

## 2021-07-16 DIAGNOSIS — H9319 Tinnitus, unspecified ear: Secondary | ICD-10-CM

## 2021-07-16 DIAGNOSIS — R519 Headache, unspecified: Secondary | ICD-10-CM

## 2021-07-31 ENCOUNTER — Ambulatory Visit (HOSPITAL_COMMUNITY)
Admission: RE | Admit: 2021-07-31 | Discharge: 2021-07-31 | Disposition: A | Discharge status: Home / Self Care | Payer: No Typology Code available for payment source | Source: Ambulatory Visit | Attending: Internal Medicine | Admitting: Internal Medicine

## 2021-07-31 DIAGNOSIS — R42 Dizziness and giddiness: Secondary | ICD-10-CM | POA: Insufficient documentation

## 2021-07-31 DIAGNOSIS — H9319 Tinnitus, unspecified ear: Secondary | ICD-10-CM | POA: Diagnosis not present

## 2021-07-31 DIAGNOSIS — R519 Headache, unspecified: Secondary | ICD-10-CM | POA: Insufficient documentation

## 2021-08-20 DIAGNOSIS — R946 Abnormal results of thyroid function studies: Secondary | ICD-10-CM | POA: Diagnosis not present

## 2021-08-20 DIAGNOSIS — Z1329 Encounter for screening for other suspected endocrine disorder: Secondary | ICD-10-CM | POA: Diagnosis not present

## 2021-09-10 DIAGNOSIS — R42 Dizziness and giddiness: Secondary | ICD-10-CM | POA: Diagnosis not present

## 2021-09-10 DIAGNOSIS — R519 Headache, unspecified: Secondary | ICD-10-CM | POA: Diagnosis not present

## 2021-09-10 DIAGNOSIS — H9319 Tinnitus, unspecified ear: Secondary | ICD-10-CM | POA: Diagnosis not present

## 2021-09-10 DIAGNOSIS — F32A Depression, unspecified: Secondary | ICD-10-CM | POA: Diagnosis not present

## 2021-09-10 DIAGNOSIS — I10 Essential (primary) hypertension: Secondary | ICD-10-CM | POA: Diagnosis not present

## 2021-10-18 ENCOUNTER — Other Ambulatory Visit: Payer: Self-pay | Admitting: *Deleted

## 2021-10-18 ENCOUNTER — Encounter: Payer: Self-pay | Admitting: *Deleted

## 2021-10-22 ENCOUNTER — Ambulatory Visit: Payer: Self-pay | Admitting: Psychiatry

## 2021-10-22 DIAGNOSIS — E039 Hypothyroidism, unspecified: Secondary | ICD-10-CM | POA: Diagnosis not present

## 2021-10-22 DIAGNOSIS — Z1329 Encounter for screening for other suspected endocrine disorder: Secondary | ICD-10-CM | POA: Diagnosis not present
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
## 2021-11-20 ENCOUNTER — Encounter: Payer: Self-pay | Admitting: Psychiatry

## 2021-11-20 ENCOUNTER — Ambulatory Visit (INDEPENDENT_AMBULATORY_CARE_PROVIDER_SITE_OTHER): Payer: No Typology Code available for payment source | Admitting: Psychiatry

## 2021-11-20 VITALS — BP 139/77 | HR 81 | Ht 65.0 in | Wt 145.8 lb

## 2021-11-20 DIAGNOSIS — R519 Headache, unspecified: Secondary | ICD-10-CM | POA: Diagnosis not present

## 2021-11-20 DIAGNOSIS — G43109 Migraine with aura, not intractable, without status migrainosus: Secondary | ICD-10-CM | POA: Diagnosis not present

## 2021-11-20 MED ORDER — GABAPENTIN 100 MG PO CAPS: 100.0000 mg | ORAL_CAPSULE | Freq: Every day | ORAL | 6 refills | Status: DC

## 2021-11-20 MED ORDER — RIZATRIPTAN BENZOATE 10 MG PO TABS: 10.0000 mg | ORAL_TABLET | ORAL | 6 refills | Status: DC | PRN

## 2021-11-20 NOTE — Patient Instructions (Signed)
Plan: MRI of the brain Start gabapentin 100 mg at bedtime for headache prevention Start rizatriptan as needed for severe headaches. Take one pill at onset of migraine. May repeat a dose in 2 hours if headache persists. Max dose 2 pills in 24 hours. Please limit using this medication to 2 days per week.

## 2021-12-10 ENCOUNTER — Telehealth: Payer: Self-pay | Admitting: Psychiatry

## 2021-12-10 NOTE — Telephone Encounter (Signed)
[employer] auth: [id number] exp. 12/06/21-01/06/22 sent to GI. There were no facilities near [place] in network with his insurance.

## 2021-12-11 DIAGNOSIS — H9319 Tinnitus, unspecified ear: Secondary | ICD-10-CM | POA: Diagnosis not present

## 2021-12-11 DIAGNOSIS — R519 Headache, unspecified: Secondary | ICD-10-CM | POA: Diagnosis not present

## 2021-12-11 DIAGNOSIS — R42 Dizziness and giddiness: Secondary | ICD-10-CM | POA: Diagnosis not present

## 2021-12-11 DIAGNOSIS — Z6824 Body mass index (BMI) 24.0-24.9, adult: Secondary | ICD-10-CM | POA: Diagnosis not present

## 2021-12-11 DIAGNOSIS — F32A Depression, unspecified: Secondary | ICD-10-CM | POA: Diagnosis not present

## 2021-12-11 DIAGNOSIS — I10 Essential (primary) hypertension: Secondary | ICD-10-CM | POA: Diagnosis not present

## 2022-01-02 ENCOUNTER — Ambulatory Visit
Admission: RE | Admit: 2022-01-02 | Discharge: 2022-01-02 | Disposition: A | Discharge status: Home / Self Care | Payer: No Typology Code available for payment source | Source: Ambulatory Visit | Attending: Psychiatry | Admitting: Psychiatry

## 2022-01-02 DIAGNOSIS — R519 Headache, unspecified: Secondary | ICD-10-CM | POA: Diagnosis not present

## 2022-01-02 MED ORDER — GADOBENATE DIMEGLUMINE 529 MG/ML IV SOLN
14.0000 mL | Freq: Once | INTRAVENOUS | Status: AC | PRN
  Administered 2022-01-02: 14 mL via INTRAVENOUS

## 2022-01-03 ENCOUNTER — Telehealth: Payer: Self-pay

## 2022-01-03 NOTE — Telephone Encounter (Signed)
Called pt to relay results. He understood and appreciated the call.  

## 2022-03-05 ENCOUNTER — Other Ambulatory Visit (HOSPITAL_COMMUNITY): Payer: Self-pay | Admitting: Internal Medicine

## 2022-03-05 DIAGNOSIS — I10 Essential (primary) hypertension: Secondary | ICD-10-CM | POA: Diagnosis not present

## 2022-03-05 DIAGNOSIS — Z6826 Body mass index (BMI) 26.0-26.9, adult: Secondary | ICD-10-CM | POA: Diagnosis not present

## 2022-03-05 DIAGNOSIS — R42 Dizziness and giddiness: Secondary | ICD-10-CM | POA: Diagnosis not present

## 2022-03-05 DIAGNOSIS — B1920 Unspecified viral hepatitis C without hepatic coma: Secondary | ICD-10-CM | POA: Diagnosis not present

## 2022-03-05 DIAGNOSIS — F32A Depression, unspecified: Secondary | ICD-10-CM | POA: Diagnosis not present

## 2022-03-05 DIAGNOSIS — R519 Headache, unspecified: Secondary | ICD-10-CM | POA: Diagnosis not present

## 2022-03-05 DIAGNOSIS — H9319 Tinnitus, unspecified ear: Secondary | ICD-10-CM | POA: Diagnosis not present

## 2022-03-05 DIAGNOSIS — R768 Other specified abnormal immunological findings in serum: Secondary | ICD-10-CM

## 2022-03-05 DIAGNOSIS — Z0001 Encounter for general adult medical examination with abnormal findings: Secondary | ICD-10-CM | POA: Diagnosis not present

## 2022-03-07 ENCOUNTER — Encounter: Payer: Self-pay | Admitting: Internal Medicine

## 2022-03-11 ENCOUNTER — Ambulatory Visit (HOSPITAL_COMMUNITY)
Admission: RE | Admit: 2022-03-11 | Discharge: 2022-03-11 | Disposition: A | Discharge status: Home / Self Care | Payer: No Typology Code available for payment source | Source: Ambulatory Visit | Attending: Internal Medicine | Admitting: Internal Medicine

## 2022-03-11 DIAGNOSIS — R768 Other specified abnormal immunological findings in serum: Secondary | ICD-10-CM | POA: Insufficient documentation

## 2022-03-11 DIAGNOSIS — N281 Cyst of kidney, acquired: Secondary | ICD-10-CM | POA: Diagnosis not present

## 2022-03-11 DIAGNOSIS — K828 Other specified diseases of gallbladder: Secondary | ICD-10-CM | POA: Diagnosis not present

## 2022-03-11 DIAGNOSIS — B1920 Unspecified viral hepatitis C without hepatic coma: Secondary | ICD-10-CM | POA: Diagnosis not present

## 2022-04-05 DIAGNOSIS — R42 Dizziness and giddiness: Secondary | ICD-10-CM | POA: Diagnosis not present

## 2022-04-05 DIAGNOSIS — Z6825 Body mass index (BMI) 25.0-25.9, adult: Secondary | ICD-10-CM | POA: Diagnosis not present

## 2022-04-05 DIAGNOSIS — H9319 Tinnitus, unspecified ear: Secondary | ICD-10-CM | POA: Diagnosis not present

## 2022-04-05 DIAGNOSIS — F17210 Nicotine dependence, cigarettes, uncomplicated: Secondary | ICD-10-CM | POA: Diagnosis not present

## 2022-04-05 DIAGNOSIS — R519 Headache, unspecified: Secondary | ICD-10-CM | POA: Diagnosis not present

## 2022-04-05 DIAGNOSIS — F32A Depression, unspecified: Secondary | ICD-10-CM | POA: Diagnosis not present

## 2022-04-05 DIAGNOSIS — I10 Essential (primary) hypertension: Secondary | ICD-10-CM | POA: Diagnosis not present

## 2022-04-05 DIAGNOSIS — B1920 Unspecified viral hepatitis C without hepatic coma: Secondary | ICD-10-CM | POA: Diagnosis not present

## 2022-04-18 ENCOUNTER — Telehealth: Payer: Self-pay | Admitting: *Deleted

## 2022-04-18 ENCOUNTER — Ambulatory Visit (INDEPENDENT_AMBULATORY_CARE_PROVIDER_SITE_OTHER): Payer: No Typology Code available for payment source | Admitting: Gastroenterology

## 2022-04-18 ENCOUNTER — Encounter: Payer: Self-pay | Admitting: Gastroenterology

## 2022-04-18 VITALS — BP 132/76 | HR 71 | Temp 97.7°F | Ht 65.0 in | Wt 155.8 lb

## 2022-04-18 DIAGNOSIS — B182 Chronic viral hepatitis C: Secondary | ICD-10-CM | POA: Diagnosis not present

## 2022-04-18 NOTE — Telephone Encounter (Signed)
LMOVM to call back to give [place] appt details 1/2, arrival 915am, npo midnight
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
## 2022-04-18 NOTE — Telephone Encounter (Signed)
Pt informed of [place] appt, date and time and instructions. Verbalized understanding.

## 2022-04-18 NOTE — Patient Instructions (Signed)
Please have blood work done at [facility] on [address]. If coming from [place], it is the light right before you get to the third light where the hospital is on your right. You will take a right at that second light, and the building will be on your right.   We are arranging a special ultrasound of your liver.  Further recommendations for treatment to follow once we receive all blood work and ultrasound!  It was a pleasure to see you today. I want to create trusting relationships with patients to provide genuine, compassionate, and quality care. I value your feedback. If you receive a survey regarding your visit,  I greatly appreciate you taking time to fill this out.   Gelene Mink, PhD, ANP-BC [facility]

## 2022-04-23 ENCOUNTER — Ambulatory Visit (HOSPITAL_COMMUNITY)
Admission: RE | Admit: 2022-04-23 | Discharge: 2022-04-23 | Disposition: A | Discharge status: Home / Self Care | Payer: No Typology Code available for payment source | Source: Ambulatory Visit | Attending: Gastroenterology | Admitting: Gastroenterology

## 2022-04-23 DIAGNOSIS — B182 Chronic viral hepatitis C: Secondary | ICD-10-CM | POA: Insufficient documentation

## 2022-04-27 LAB — COMPREHENSIVE METABOLIC PANEL
ALT: 14 IU/L (ref 0–44)
AST: 22 IU/L (ref 0–40)
Albumin/Globulin Ratio: 1.5 (ref 1.2–2.2)
Albumin: 4.8 g/dL (ref 3.8–4.8)
Alkaline Phosphatase: 79 IU/L (ref 44–121)
BUN/Creatinine Ratio: 15 (ref 10–24)
BUN: 21 mg/dL (ref 8–27)
Bilirubin Total: 0.3 mg/dL (ref 0.0–1.2)
CO2: 22 mmol/L (ref 20–29)
Calcium: 10 mg/dL (ref 8.6–10.2)
Chloride: 100 mmol/L (ref 96–106)
Creatinine, Ser: 1.43 mg/dL — ABNORMAL HIGH (ref 0.76–1.27)
Globulin, Total: 3.1 g/dL (ref 1.5–4.5)
Glucose: 103 mg/dL — ABNORMAL HIGH (ref 70–99)
Potassium: 4.5 mmol/L (ref 3.5–5.2)
Sodium: 141 mmol/L (ref 134–144)
Total Protein: 7.9 g/dL (ref 6.0–8.5)
eGFR: 51 mL/min/{1.73_m2} — ABNORMAL LOW (ref >59)

## 2022-04-27 LAB — CBC WITH DIFFERENTIAL/PLATELET
Basophils Absolute: 0.1 10*3/uL (ref 0.0–0.2)
Basos: 1 %
EOS (ABSOLUTE): 0.1 10*3/uL (ref 0.0–0.4)
Eos: 1 %
Hematocrit: 50.6 % (ref 37.5–51.0)
Hemoglobin: 16.6 g/dL (ref 13.0–17.7)
Immature Grans (Abs): 0 10*3/uL (ref 0.0–0.1)
Immature Granulocytes: 0 %
Lymphocytes Absolute: 1.2 10*3/uL (ref 0.7–3.1)
Lymphs: 14 %
MCH: 29.1 pg (ref 26.6–33.0)
MCHC: 32.8 g/dL (ref 31.5–35.7)
MCV: 89 fL (ref 79–97)
Monocytes Absolute: 0.7 10*3/uL (ref 0.1–0.9)
Monocytes: 7 %
Neutrophils Absolute: 7 10*3/uL (ref 1.4–7.0)
Neutrophils: 77 %
Platelets: 138 10*3/uL — ABNORMAL LOW (ref 150–450)
RBC: 5.71 x10E6/uL (ref 4.14–5.80)
RDW: 14.3 % (ref 11.6–15.4)
WBC: 9.1 10*3/uL (ref 3.4–10.8)

## 2022-04-27 LAB — HEPATITIS B SURFACE ANTIGEN

## 2022-04-27 LAB — HEPATITIS A ANTIBODY, TOTAL: hep A Total Ab: NEGATIVE

## 2022-04-27 LAB — HCV RNA QUANT RFLX ULTRA OR GENOTYP
HCV Quant Baseline: 5460000 IU/mL
HCV log10: 6.737 log10 IU/mL

## 2022-04-27 LAB — HEPATITIS B CORE ANTIBODY, TOTAL: Hep B Core Total Ab: POSITIVE — AB

## 2022-04-27 LAB — HEPATITIS C GENOTYPE

## 2022-04-27 LAB — HEPATITIS B SURFACE ANTIBODY,QUALITATIVE: Hep B Surface Ab, Qual: NONREACTIVE

## 2022-04-27 LAB — PROTIME-INR
INR: 1 (ref 0.9–1.2)
Prothrombin Time: 10.9 s (ref 9.1–12.0)

## 2022-04-27 LAB — HIV ANTIBODY (ROUTINE TESTING W REFLEX): HIV Screen 4th Generation wRfx: NONREACTIVE

## 2022-04-27 LAB — HEPATITIS B SURFACE AG, CONFIRM: HBsAG Confirmation: POSITIVE — AB

## 2022-05-08 ENCOUNTER — Other Ambulatory Visit (INDEPENDENT_AMBULATORY_CARE_PROVIDER_SITE_OTHER): Payer: Self-pay | Admitting: *Deleted

## 2022-05-08 DIAGNOSIS — B182 Chronic viral hepatitis C: Secondary | ICD-10-CM

## 2022-05-08 DIAGNOSIS — B1910 Unspecified viral hepatitis B without hepatic coma: Secondary | ICD-10-CM

## 2022-06-24 ENCOUNTER — Telehealth: Payer: Self-pay | Admitting: Psychiatry

## 2022-06-24 ENCOUNTER — Ambulatory Visit: Payer: No Typology Code available for payment source | Admitting: Psychiatry

## 2022-06-24 MED ORDER — GABAPENTIN 100 MG PO CAPS: 100.0000 mg | ORAL_CAPSULE | Freq: Every day | ORAL | 0 refills | Status: AC

## 2022-06-24 MED ORDER — RIZATRIPTAN BENZOATE 10 MG PO TABS: 10.0000 mg | ORAL_TABLET | ORAL | 0 refills | Status: AC | PRN

## 2022-06-24 NOTE — Telephone Encounter (Signed)
Follow up visit rescheduled to 07/24/22  Rx sent.

## 2022-06-24 NOTE — Telephone Encounter (Signed)
Patient is requesting that Dr.Chima resend previous scripts due to not being able to get before due finances. He was not feeling well today and was rescheduled but asked to be contacted for any further information needed for prescriptions.

## 2022-06-24 NOTE — Addendum Note (Signed)
Addended by: Thamas Jaegers on: 06/24/2022 10:56 AM   Modules accepted: Orders

## 2022-06-24 NOTE — Progress Notes (Deleted)
   CC:  headaches  Follow-up Visit  Last visit: 11/20/21  Brief HPI: 77 year old male with a history of depression, HTN, hypothyroidism who follows in clinic for migraines and vertigo.   At his last visit, MRI brain was ordered. He was started on gabapentin for migraine prevention and Maxalt for rescue. Interval History: Migraines***visual aura***gabapentin***  MRI brain 01/02/22 was normal.  Headache days per month: *** Migraine days per month*** Headache free days per month: ***  Current Headache Regimen: Preventative: *** Abortive: ***   Prior Therapies                                  lisinopril Zoloft gabapentin  Physical Exam:   Vital Signs: There were no vitals taken for this visit. GENERAL:  well appearing, in no acute distress, alert  SKIN:  Color, texture, turgor normal. No rashes or lesions HEAD:  Normocephalic/atraumatic. RESP: normal respiratory effort MSK:  No gross joint deformities.   NEUROLOGICAL: Mental Status: Alert, oriented to person, place and time, Follows commands, and Speech fluent and appropriate. Cranial Nerves: PERRL, face symmetric, no dysarthria, hearing grossly intact Motor: moves all extremities equally Gait: normal-based.  IMPRESSION: ***  PLAN: ***   Follow-up: ***  I spent a total of *** minutes on the date of the service. Headache education was done. Discussed lifestyle modification including increased oral hydration, decreased caffeine, exercise and stress management. Discussed treatment options including preventive and acute medications, natural supplements, and infusion therapy. Discussed medication overuse headache and to limit use of acute treatments to no more than 2 days/week or 10 days/month. Discussed medication side effects, adverse reactions and drug interactions. Written educational materials and patient instructions outlining all of the above were given.  Genia Harold, MD

## 2022-07-24 ENCOUNTER — Inpatient Hospital Stay: Payer: No Typology Code available for payment source | Admitting: Psychiatry

## 2022-08-05 DIAGNOSIS — Z13220 Encounter for screening for lipoid disorders: Secondary | ICD-10-CM | POA: Diagnosis not present

## 2022-08-05 DIAGNOSIS — Z0001 Encounter for general adult medical examination with abnormal findings: Secondary | ICD-10-CM | POA: Diagnosis not present

## 2022-08-05 DIAGNOSIS — I10 Essential (primary) hypertension: Secondary | ICD-10-CM | POA: Diagnosis not present

## 2022-08-05 DIAGNOSIS — Z1329 Encounter for screening for other suspected endocrine disorder: Secondary | ICD-10-CM | POA: Diagnosis not present

## 2022-08-05 DIAGNOSIS — R42 Dizziness and giddiness: Secondary | ICD-10-CM | POA: Diagnosis not present

## 2022-08-12 DIAGNOSIS — F17210 Nicotine dependence, cigarettes, uncomplicated: Secondary | ICD-10-CM | POA: Diagnosis not present

## 2022-08-12 DIAGNOSIS — F32A Depression, unspecified: Secondary | ICD-10-CM | POA: Diagnosis not present

## 2022-08-12 DIAGNOSIS — B1920 Unspecified viral hepatitis C without hepatic coma: Secondary | ICD-10-CM | POA: Diagnosis not present

## 2022-08-12 DIAGNOSIS — H9319 Tinnitus, unspecified ear: Secondary | ICD-10-CM | POA: Diagnosis not present

## 2022-08-12 DIAGNOSIS — I10 Essential (primary) hypertension: Secondary | ICD-10-CM | POA: Diagnosis not present

## 2022-08-12 DIAGNOSIS — Z6824 Body mass index (BMI) 24.0-24.9, adult: Secondary | ICD-10-CM | POA: Diagnosis not present

## 2022-08-12 DIAGNOSIS — B1910 Unspecified viral hepatitis B without hepatic coma: Secondary | ICD-10-CM | POA: Diagnosis not present

## 2022-08-12 DIAGNOSIS — R519 Headache, unspecified: Secondary | ICD-10-CM | POA: Diagnosis not present

## 2022-08-12 DIAGNOSIS — Z0001 Encounter for general adult medical examination with abnormal findings: Secondary | ICD-10-CM | POA: Diagnosis not present

## 2022-08-12 DIAGNOSIS — R42 Dizziness and giddiness: Secondary | ICD-10-CM | POA: Diagnosis not present

## 2022-08-13 ENCOUNTER — Other Ambulatory Visit (HOSPITAL_COMMUNITY): Payer: Self-pay | Admitting: Internal Medicine

## 2022-08-13 DIAGNOSIS — M79604 Pain in right leg: Secondary | ICD-10-CM

## 2022-08-23 ENCOUNTER — Ambulatory Visit (HOSPITAL_COMMUNITY): Payer: No Typology Code available for payment source

## 2022-08-23 ENCOUNTER — Encounter (HOSPITAL_COMMUNITY): Payer: Self-pay

## 2022-08-29 ENCOUNTER — Ambulatory Visit: Payer: No Typology Code available for payment source | Admitting: Gastroenterology

## 2022-08-29 ENCOUNTER — Telehealth: Payer: Self-pay | Admitting: Gastroenterology

## 2022-08-29 ENCOUNTER — Encounter: Payer: Self-pay | Admitting: Gastroenterology

## 2022-08-29 ENCOUNTER — Other Ambulatory Visit: Payer: Self-pay | Admitting: *Deleted

## 2022-08-29 DIAGNOSIS — B1910 Unspecified viral hepatitis B without hepatic coma: Secondary | ICD-10-CM

## 2022-08-29 DIAGNOSIS — B182 Chronic viral hepatitis C: Secondary | ICD-10-CM

## 2022-08-29 NOTE — Telephone Encounter (Signed)
Mindy/Tammy:  Please refer patient to ID in [place]. 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 Dawn Drazek with [facility] does not accept his insurance. Thanks!  Please let him know we are referring him elsewhere.

## 2022-08-29 NOTE — Telephone Encounter (Signed)
Referral sent to ID at [facility] for Infectious Disease.  LMTRC

## 2022-08-29 NOTE — Telephone Encounter (Signed)
Pt return call and left vm  [facility]

## 2022-09-10 NOTE — Telephone Encounter (Signed)
Pt has an appointment on 09/12/22 with ID, Odette Fraction, MD

## 2022-09-11 DIAGNOSIS — I10 Essential (primary) hypertension: Secondary | ICD-10-CM | POA: Diagnosis not present

## 2022-09-12 ENCOUNTER — Ambulatory Visit: Payer: No Typology Code available for payment source | Admitting: Infectious Diseases

## 2022-09-12 ENCOUNTER — Other Ambulatory Visit: Payer: Self-pay

## 2022-09-12 ENCOUNTER — Telehealth: Payer: Self-pay

## 2022-09-12 ENCOUNTER — Encounter: Payer: Self-pay | Admitting: Infectious Diseases

## 2022-09-12 VITALS — BP 108/80 | HR 68 | Temp 97.8°F | Ht 65.0 in | Wt 144.0 lb

## 2022-09-12 DIAGNOSIS — B182 Chronic viral hepatitis C: Secondary | ICD-10-CM

## 2022-09-12 DIAGNOSIS — Z129 Encounter for screening for malignant neoplasm, site unspecified: Secondary | ICD-10-CM | POA: Diagnosis not present

## 2022-09-12 DIAGNOSIS — Z23 Encounter for immunization: Secondary | ICD-10-CM | POA: Insufficient documentation

## 2022-09-12 DIAGNOSIS — B181 Chronic viral hepatitis B without delta-agent: Secondary | ICD-10-CM

## 2022-09-12 LAB — CBC
MCV: 87.7 fL (ref 80.0–100.0)
Platelets: 147 10*3/uL (ref 140–400)
RBC: 4.79 10*6/uL (ref 4.20–5.80)
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
## 2022-09-12 NOTE — Telephone Encounter (Signed)
Lab reported patient was light headed and turning pale initial vitals 89/60. Patient  stated that he has not had anything to eat today, was given water and crackers. After 10-15 minutes patient was doing well, talking with staff, no longer pale and vitals were 108/60. Patient left office in stable condition. Providers was notified.

## 2022-09-13 LAB — COMPREHENSIVE METABOLIC PANEL
ALT: 11 U/L (ref 9–46)
Alkaline phosphatase (APISO): 56 U/L (ref 35–144)
BUN/Creatinine Ratio: 12 (calc) (ref 6–22)
BUN: 16 mg/dL (ref 7–25)
Creat: 1.34 mg/dL — ABNORMAL HIGH (ref 0.70–1.28)
Glucose, Bld: 84 mg/dL (ref 65–99)
Sodium: 139 mmol/L (ref 135–146)

## 2022-09-14 LAB — HEPATITIS B SURFACE ANTIBODY,QUALITATIVE: Hep B S Ab: NONREACTIVE

## 2022-09-18 LAB — HEPATITIS DELTA ANTIBODY: Hepatitis D Ab, Total: NEGATIVE

## 2022-09-19 ENCOUNTER — Ambulatory Visit (HOSPITAL_COMMUNITY): Payer: No Typology Code available for payment source

## 2022-09-25 LAB — COMPREHENSIVE METABOLIC PANEL
AG Ratio: 1.4 (calc) (ref 1.0–2.5)
AST: 17 U/L (ref 10–35)
Albumin: 3.9 g/dL (ref 3.6–5.1)
CO2: 24 mmol/L (ref 20–32)
Calcium: 8.9 mg/dL (ref 8.6–10.3)
Chloride: 104 mmol/L (ref 98–110)
Globulin: 2.8 g/dL (calc) (ref 1.9–3.7)
Potassium: 4.3 mmol/L (ref 3.5–5.3)
Total Bilirubin: 0.5 mg/dL (ref 0.2–1.2)
Total Protein: 6.7 g/dL (ref 6.1–8.1)

## 2022-09-25 LAB — HEPATITIS C RNA QUANTITATIVE
HCV Quantitative Log: 6.61 log IU/mL — ABNORMAL HIGH
HCV RNA, PCR, QN: 4060000 IU/mL — ABNORMAL HIGH

## 2022-09-25 LAB — HEPATITIS B DNA, ULTRAQUANTITATIVE, PCR
Hepatitis B DNA: <10 IU/mL — ABNORMAL HIGH
Hepatitis B virus DNA: <1 Log IU/mL — ABNORMAL HIGH

## 2022-09-25 LAB — CBC
HCT: 42 % (ref 38.5–50.0)
Hemoglobin: 14.1 g/dL (ref 13.2–17.1)
MCH: 29.4 pg (ref 27.0–33.0)
MCHC: 33.6 g/dL (ref 32.0–36.0)
MPV: 13.2 fL — ABNORMAL HIGH (ref 7.5–12.5)
RDW: 13.6 % (ref 11.0–15.0)
WBC: 9.8 10*3/uL (ref 3.8–10.8)

## 2022-09-25 LAB — HEPATITIS B SURFACE ANTIGEN
Confirmation: REACTIVE — AB
Hepatitis B Surface Ag: REACTIVE — AB

## 2022-09-25 LAB — LIVER FIBROSIS, FIBROTEST-ACTITEST
ALT: 11 U/L (ref 9–46)
Alpha-2-Macroglobulin: 299 mg/dL — ABNORMAL HIGH (ref 106–279)
Apolipoprotein A1: 158 mg/dL (ref 94–176)
Bilirubin: 0.4 mg/dL (ref 0.2–1.2)
Fibrosis Score: 0.36
GGT: 13 U/L (ref 3–70)
Haptoglobin: 176 mg/dL (ref 43–212)
Necroinflammat ACT Score: 0.03
Reference ID: 4946781

## 2022-09-25 LAB — PROTIME-INR
INR: 1
Prothrombin Time: 10.5 s (ref 9.0–11.5)

## 2022-09-25 LAB — HEPATITIS B E ANTIGEN: Hep B E Ag: NONREACTIVE

## 2022-09-25 LAB — HEPATITIS A ANTIBODY, TOTAL: Hepatitis A AB,Total: NONREACTIVE

## 2022-09-25 LAB — HEPATITIS B E ANTIBODY: Hep B E Ab: REACTIVE — AB

## 2022-09-25 LAB — HIV ANTIBODY (ROUTINE TESTING W REFLEX): HIV 1&2 Ab, 4th Generation: NONREACTIVE

## 2022-09-25 LAB — HEPATITIS B CORE ANTIBODY, TOTAL: Hep B Core Total Ab: REACTIVE — AB

## 2022-10-03 ENCOUNTER — Ambulatory Visit (HOSPITAL_COMMUNITY)
Admission: RE | Admit: 2022-10-03 | Discharge: 2022-10-03 | Disposition: A | Discharge status: Home / Self Care | Payer: No Typology Code available for payment source | Source: Ambulatory Visit | Attending: Infectious Diseases | Admitting: Infectious Diseases

## 2022-10-03 DIAGNOSIS — B182 Chronic viral hepatitis C: Secondary | ICD-10-CM | POA: Diagnosis not present

## 2022-10-17 ENCOUNTER — Other Ambulatory Visit (HOSPITAL_COMMUNITY): Payer: Self-pay

## 2022-10-21 ENCOUNTER — Telehealth: Payer: Self-pay

## 2022-10-21 ENCOUNTER — Other Ambulatory Visit (HOSPITAL_COMMUNITY): Payer: Self-pay

## 2022-10-21 NOTE — Telephone Encounter (Signed)
[facility] Patient Advocate Encounter   Received notification from [facility] that prior authorization for Joseph Glass is required.   PA submitted on 10/21/22 Key [id number] Status is pending    [facility] will continue to follow.   Clearance Coots, CPhT Specialty Pharmacy Patient [facility] for Infectious Disease Phone: [phone] Fax:  [phone]

## 2022-10-22 ENCOUNTER — Other Ambulatory Visit (HOSPITAL_COMMUNITY): Payer: Self-pay

## 2022-10-22 ENCOUNTER — Telehealth: Payer: Self-pay

## 2022-10-22 NOTE — Telephone Encounter (Signed)
[facility] Patient Advocate Encounter  Prior Authorization for Joseph Glass [employer]) has been approved.    PA# [id number] Effective dates: 10/22/22 through 01/13/23  Patients co-pay is $3,271.06.   There are no funds available for HEP-C for medicare part d patients, will continue to keep searching.  [facility] will continue to follow.  Clearance Coots, CPhT Specialty Pharmacy Patient [facility] for Infectious Disease Phone: [phone] Fax:  [phone]

## 2022-10-28 ENCOUNTER — Other Ambulatory Visit (HOSPITAL_COMMUNITY): Payer: Self-pay

## 2022-11-11 ENCOUNTER — Telehealth: Payer: Self-pay

## 2022-11-11 NOTE — Telephone Encounter (Signed)
-----   Message from Victoriano Lain sent at 11/11/2022  1:08 PM EDT ----- Regarding: HBV and HCV tx Team,  Please let him know that his co-pay for Hepatitis B and C treatment is over 3000$. Please see Donna's note regarding co-pay.  There are no funds available for HEP-C for medicare part d patients per discussion with Cassie. So until funds are available, will have to wait unless the patient is able to afford such high co-pay which I doubt he will be able to. I tried called him couple of times but unsuccessful.

## 2022-11-11 NOTE — Telephone Encounter (Signed)
Called patient to relay provider's message, call could not be completed.   Sandie Ano, RN

## 2022-11-13 NOTE — Telephone Encounter (Signed)
Second attempt to reach patient, no answer. Left HIPAA compliant voicemail requesting callback.   Megan D Burroughs, RN  

## 2022-11-14 DIAGNOSIS — R519 Headache, unspecified: Secondary | ICD-10-CM | POA: Diagnosis not present

## 2022-11-14 DIAGNOSIS — B1910 Unspecified viral hepatitis B without hepatic coma: Secondary | ICD-10-CM | POA: Diagnosis not present

## 2022-11-14 DIAGNOSIS — F17210 Nicotine dependence, cigarettes, uncomplicated: Secondary | ICD-10-CM | POA: Diagnosis not present

## 2022-11-14 DIAGNOSIS — Z6823 Body mass index (BMI) 23.0-23.9, adult: Secondary | ICD-10-CM | POA: Diagnosis not present

## 2022-11-14 DIAGNOSIS — B1920 Unspecified viral hepatitis C without hepatic coma: Secondary | ICD-10-CM | POA: Diagnosis not present

## 2022-11-14 DIAGNOSIS — F32A Depression, unspecified: Secondary | ICD-10-CM | POA: Diagnosis not present

## 2022-11-14 DIAGNOSIS — R42 Dizziness and giddiness: Secondary | ICD-10-CM | POA: Diagnosis not present

## 2022-11-14 DIAGNOSIS — I10 Essential (primary) hypertension: Secondary | ICD-10-CM | POA: Diagnosis not present

## 2022-11-14 NOTE — Telephone Encounter (Signed)
Patient advised of information below. Joseph Glass Joseph Glass

## 2022-11-14 NOTE — Telephone Encounter (Signed)
Third attempt to reach patient, no answer. Message left with previous attempt. Letter mailed to address on file.   Sandie Ano, RN

## 2022-12-05 ENCOUNTER — Ambulatory Visit: Payer: No Typology Code available for payment source | Admitting: Gastroenterology

## 2022-12-05 ENCOUNTER — Telehealth: Payer: Self-pay | Admitting: Gastroenterology

## 2022-12-05 NOTE — Telephone Encounter (Signed)
Patient came to [facility]. At 18 and said that he was never seen at [facility].  He stated that he sat in the office from 10am until 10:40 until finally he was asked why he was there and was never seen so he came to [facility].  I followed up with Mitzi and she said he never came in to the office there.  I asked him if he wanted to reschedule and he asked what was the purpose ??  He said that we wouldn't give him any medication.  I told him that he was told by his doctor to follow up here because of his Hep C and he wanted to know does he really need to follow up.

## 2022-12-05 NOTE — Telephone Encounter (Signed)
Joseph Glass,  This pt was up there today and apparently not seen. He is seeing infectious diseases for his condition. Pt wants to know do he really need appt to see [place]. I didn't know he was on your schedule today until I just looked. Please advise

## 2022-12-11 NOTE — Telephone Encounter (Signed)
No, he needs to follow up with ID.

## 2022-12-12 NOTE — Telephone Encounter (Signed)
FYI  Phoned and spoke with the pt he stated he is not following up with ID because nobody seems to know what he is doing. Pt states he is not going to worry about his condition

## 2022-12-14 DIAGNOSIS — S42022A Displaced fracture of shaft of left clavicle, initial encounter for closed fracture: Secondary | ICD-10-CM | POA: Diagnosis not present

## 2022-12-14 DIAGNOSIS — Z5321 Procedure and treatment not carried out due to patient leaving prior to being seen by health care provider: Secondary | ICD-10-CM | POA: Diagnosis not present

## 2022-12-14 DIAGNOSIS — K529 Noninfective gastroenteritis and colitis, unspecified: Secondary | ICD-10-CM | POA: Diagnosis not present

## 2022-12-14 DIAGNOSIS — R059 Cough, unspecified: Secondary | ICD-10-CM | POA: Diagnosis not present

## 2022-12-14 DIAGNOSIS — Z20822 Contact with and (suspected) exposure to covid-19: Secondary | ICD-10-CM | POA: Diagnosis not present

## 2022-12-14 DIAGNOSIS — R1084 Generalized abdominal pain: Secondary | ICD-10-CM | POA: Diagnosis not present

## 2022-12-14 DIAGNOSIS — Z1152 Encounter for screening for COVID-19: Secondary | ICD-10-CM | POA: Diagnosis not present

## 2022-12-14 DIAGNOSIS — R109 Unspecified abdominal pain: Secondary | ICD-10-CM | POA: Diagnosis not present

## 2022-12-14 DIAGNOSIS — K5730 Diverticulosis of large intestine without perforation or abscess without bleeding: Secondary | ICD-10-CM | POA: Diagnosis not present

## 2022-12-14 DIAGNOSIS — R112 Nausea with vomiting, unspecified: Secondary | ICD-10-CM | POA: Diagnosis not present

## 2022-12-19 DIAGNOSIS — B1920 Unspecified viral hepatitis C without hepatic coma: Secondary | ICD-10-CM | POA: Diagnosis not present

## 2022-12-19 DIAGNOSIS — R42 Dizziness and giddiness: Secondary | ICD-10-CM | POA: Diagnosis not present

## 2022-12-19 DIAGNOSIS — I10 Essential (primary) hypertension: Secondary | ICD-10-CM | POA: Diagnosis not present

## 2022-12-19 DIAGNOSIS — F32A Depression, unspecified: Secondary | ICD-10-CM | POA: Diagnosis not present

## 2022-12-19 DIAGNOSIS — F17210 Nicotine dependence, cigarettes, uncomplicated: Secondary | ICD-10-CM | POA: Diagnosis not present

## 2022-12-19 DIAGNOSIS — Z6823 Body mass index (BMI) 23.0-23.9, adult: Secondary | ICD-10-CM | POA: Diagnosis not present

## 2022-12-19 DIAGNOSIS — R519 Headache, unspecified: Secondary | ICD-10-CM | POA: Diagnosis not present

## 2022-12-19 DIAGNOSIS — B1910 Unspecified viral hepatitis B without hepatic coma: Secondary | ICD-10-CM | POA: Diagnosis not present

## 2022-12-26 NOTE — Telephone Encounter (Signed)
I'm sorry to hear this! He needs to follow up with ID

## 2022-12-27 NOTE — Telephone Encounter (Signed)
Phoned and LMOVM for the pt to return call on Monday

## 2022-12-30 NOTE — Telephone Encounter (Signed)
Phoned and LMOVM for the pt to return call 

## 2022-12-31 NOTE — Telephone Encounter (Signed)
Letter mailed to the pt to keep his follow up appts with infectious disease

## 2023-03-12 DIAGNOSIS — I10 Essential (primary) hypertension: Secondary | ICD-10-CM | POA: Diagnosis not present

## 2023-03-12 DIAGNOSIS — R42 Dizziness and giddiness: Secondary | ICD-10-CM | POA: Diagnosis not present

## 2023-03-18 DIAGNOSIS — Z6823 Body mass index (BMI) 23.0-23.9, adult: Secondary | ICD-10-CM | POA: Diagnosis not present

## 2023-03-18 DIAGNOSIS — F32A Depression, unspecified: Secondary | ICD-10-CM | POA: Diagnosis not present

## 2023-03-18 DIAGNOSIS — I10 Essential (primary) hypertension: Secondary | ICD-10-CM | POA: Diagnosis not present

## 2023-03-18 DIAGNOSIS — B1920 Unspecified viral hepatitis C without hepatic coma: Secondary | ICD-10-CM | POA: Diagnosis not present

## 2023-03-18 DIAGNOSIS — R42 Dizziness and giddiness: Secondary | ICD-10-CM | POA: Diagnosis not present

## 2023-03-18 DIAGNOSIS — R519 Headache, unspecified: Secondary | ICD-10-CM | POA: Diagnosis not present

## 2023-03-18 DIAGNOSIS — F17210 Nicotine dependence, cigarettes, uncomplicated: Secondary | ICD-10-CM | POA: Diagnosis not present

## 2024-01-29 NOTE — Progress Notes (Signed)
 Joseph Glass                                          MRN: [id number]   01/29/2024   The [facility] Quality Team Specialist reviewed this patient medical record for the purposes of chart review for care gap closure. The following were reviewed: chart review for care gap closure-controlling blood pressure.    [facility] Quality Team
